# Patient Record
Sex: Male | Born: 1975 | Race: White | Hispanic: No | Marital: Married | Smoking: Never smoker
Health system: Southern US, Community
[De-identification: ages and names within clinical notes are randomized; demographics above are authoritative.]

## PROBLEM LIST (undated history)

## (undated) DIAGNOSIS — L570 Actinic keratosis: Secondary | ICD-10-CM

## (undated) DIAGNOSIS — I809 Phlebitis and thrombophlebitis of unspecified site: Secondary | ICD-10-CM

## (undated) DIAGNOSIS — S8290XA Unspecified fracture of unspecified lower leg, initial encounter for closed fracture: Secondary | ICD-10-CM

## (undated) HISTORY — PX: HAND SURGERY: SHX662

## (undated) HISTORY — DX: Unspecified fracture of unspecified lower leg, initial encounter for closed fracture: S82.90XA

## (undated) HISTORY — DX: Phlebitis and thrombophlebitis of unspecified site: I80.9

## (undated) HISTORY — DX: Actinic keratosis: L57.0

---

## 2009-10-26 LAB — HM COLONOSCOPY

## 2013-03-02 ENCOUNTER — Encounter: Payer: Self-pay | Admitting: Family Medicine

## 2013-09-29 ENCOUNTER — Other Ambulatory Visit: Payer: Self-pay | Admitting: *Deleted

## 2013-09-29 DIAGNOSIS — Z0000 Encounter for general adult medical examination without abnormal findings: Secondary | ICD-10-CM

## 2013-10-02 ENCOUNTER — Other Ambulatory Visit: Payer: Self-pay

## 2013-10-02 LAB — COMPLETE METABOLIC PANEL WITH GFR
ALT: 17 U/L (ref 0–53)
AST: 20 U/L (ref 0–37)
Albumin: 4.5 g/dL (ref 3.5–5.2)
Alkaline Phosphatase: 67 U/L (ref 39–117)
BUN: 17 mg/dL (ref 6–23)
CO2: 31 mEq/L (ref 19–32)
Calcium: 9.8 mg/dL (ref 8.4–10.5)
Chloride: 102 mEq/L (ref 96–112)
Creat: 0.99 mg/dL (ref 0.50–1.35)
GFR, Est African American: >89 mL/min
GFR, Est Non African American: >89 mL/min
Glucose, Bld: 94 mg/dL (ref 70–99)
Potassium: 4.2 mEq/L (ref 3.5–5.3)
Sodium: 138 mEq/L (ref 135–145)
Total Bilirubin: 0.7 mg/dL (ref 0.3–1.2)
Total Protein: 7.2 g/dL (ref 6.0–8.3)

## 2013-10-02 LAB — CBC WITH DIFFERENTIAL/PLATELET
Basophils Absolute: 0 10*3/uL (ref 0.0–0.1)
Basophils Relative: 0 % (ref 0–1)
Eosinophils Absolute: 0.3 10*3/uL (ref 0.0–0.7)
Eosinophils Relative: 7 % — ABNORMAL HIGH (ref 0–5)
HCT: 44.1 % (ref 39.0–52.0)
Hemoglobin: 16.1 g/dL (ref 13.0–17.0)
Lymphocytes Relative: 35 % (ref 12–46)
Lymphs Abs: 1.7 10*3/uL (ref 0.7–4.0)
MCH: 32.4 pg (ref 26.0–34.0)
MCHC: 36.5 g/dL — ABNORMAL HIGH (ref 30.0–36.0)
MCV: 88.7 fL (ref 78.0–100.0)
Monocytes Absolute: 0.5 10*3/uL (ref 0.1–1.0)
Monocytes Relative: 9 % (ref 3–12)
Neutro Abs: 2.5 10*3/uL (ref 1.7–7.7)
Neutrophils Relative %: 49 % (ref 43–77)
Platelets: 231 10*3/uL (ref 150–400)
RBC: 4.97 MIL/uL (ref 4.22–5.81)
RDW: 12.2 % (ref 11.5–15.5)
WBC: 4.9 10*3/uL (ref 4.0–10.5)

## 2013-10-02 LAB — TSH: TSH: 2.213 u[IU]/mL (ref 0.350–4.500)

## 2013-10-02 LAB — LIPID PANEL
Cholesterol: 163 mg/dL (ref 0–200)
HDL: 38 mg/dL — ABNORMAL LOW (ref >39)
LDL Cholesterol: 108 mg/dL — ABNORMAL HIGH (ref 0–99)
Total CHOL/HDL Ratio: 4.3 Ratio
Triglycerides: 86 mg/dL (ref <150)
VLDL: 17 mg/dL (ref 0–40)

## 2013-10-06 ENCOUNTER — Encounter: Payer: Self-pay | Admitting: Family Medicine

## 2013-10-06 ENCOUNTER — Encounter (INDEPENDENT_AMBULATORY_CARE_PROVIDER_SITE_OTHER): Payer: Self-pay

## 2013-10-06 ENCOUNTER — Ambulatory Visit (INDEPENDENT_AMBULATORY_CARE_PROVIDER_SITE_OTHER): Payer: BC Managed Care – PPO | Admitting: Family Medicine

## 2013-10-06 VITALS — BP 103/65 | HR 68 | Resp 16 | Ht 73.5 in | Wt 190.0 lb

## 2013-10-06 DIAGNOSIS — M940 Chondrocostal junction syndrome [Tietze]: Secondary | ICD-10-CM

## 2013-10-06 DIAGNOSIS — E785 Hyperlipidemia, unspecified: Secondary | ICD-10-CM

## 2013-10-06 MED ORDER — DICLOFENAC SODIUM 1 % TD GEL: 4.0000 g | Freq: Four times a day (QID) | TRANSDERMAL | Status: AC

## 2013-10-06 NOTE — Patient Instructions (Addendum)
Costochondritis Costochondritis (Tietze syndrome), or costochondral separation, is a swelling and irritation (inflammation) of the tissue (cartilage) that connects your ribs with your breastbone (sternum). It may occur on its own (spontaneously), through damage caused by an accident (trauma), or simply from coughing or minor exercise. It may take up to 6 weeks to get better and longer if you are unable to be conservative in your activities. HOME CARE INSTRUCTIONS   Avoid exhausting physical activity. Try not to strain your ribs during normal activity. This would include any activities using chest, belly (abdominal), and side muscles, especially if heavy weights are used.  Use ice for 15-20 minutes per hour while awake for the first 2 days. Place the ice in a plastic bag, and place a towel between the bag of ice and your skin.  Only take over-the-counter or prescription medicines for pain, discomfort, or fever as directed by your caregiver. SEEK IMMEDIATE MEDICAL CARE IF:   Your pain increases or you are very uncomfortable.  You have a fever.  You develop difficulty with your breathing.  You cough up blood.  You develop worse chest pains, shortness of breath, sweating, or vomiting.  You develop new, unexplained problems (symptoms). MAKE SURE YOU:   Understand these instructions.  Will watch your condition.  Will get help right away if you are not doing well or get worse. Document Released: 07/22/2005 Document Revised: 01/04/2012 Document Reviewed: 05/16/2013 [facility] Patient Information 2014 [place], [place].  Fat and Cholesterol Control Diet Fat and cholesterol levels in your blood and organs are influenced by your diet. High levels of fat and cholesterol may lead to diseases of the heart, small and large blood vessels, gallbladder, liver, and pancreas. CONTROLLING FAT AND CHOLESTEROL WITH DIET Although exercise and lifestyle factors are important, your diet is key. That is because  certain foods are known to raise cholesterol and others to lower it. The goal is to balance foods for their effect on cholesterol and more importantly, to replace saturated and trans fat with other types of fat, such as monounsaturated fat, polyunsaturated fat, and omega-3 fatty acids. On average, a person should consume no more than 15 to 17 g of saturated fat daily. Saturated and trans fats are considered "bad" fats, and they will raise LDL cholesterol. Saturated fats are primarily found in animal products such as meats, butter, and cream. However, that does not mean you need to give up all your favorite foods. Today, there are good tasting, low-fat, low-cholesterol substitutes for most of the things you like to eat. Choose low-fat or nonfat alternatives. Choose round or loin cuts of red meat. These types of cuts are lowest in fat and cholesterol. Chicken (without the skin), fish, veal, and ground [place] breast are great choices. Eliminate fatty meats, such as hot dogs and salami. Even shellfish have little or no saturated fat. Have a 3 oz (85 g) portion when you eat lean meat, poultry, or fish. Trans fats are also called "partially hydrogenated oils." They are oils that have been scientifically manipulated so that they are solid at room temperature resulting in a longer shelf life and improved taste and texture of foods in which they are added. Trans fats are found in stick margarine, some tub margarines, cookies, crackers, and baked goods.  When baking and cooking, oils are a great substitute for butter. The monounsaturated oils are especially beneficial since it is believed they lower LDL and raise HDL. The oils you should avoid entirely are saturated tropical oils, such as  coconut and palm.  Remember to eat a lot from food groups that are naturally free of saturated and trans fat, including fish, fruit, vegetables, beans, grains (barley, rice, couscous, bulgur wheat), and pasta (without cream sauces).    IDENTIFYING FOODS THAT LOWER FAT AND CHOLESTEROL  Soluble fiber may lower your cholesterol. This type of fiber is found in fruits such as apples, vegetables such as broccoli, potatoes, and carrots, legumes such as beans, peas, and lentils, and grains such as barley. Foods fortified with plant sterols (phytosterol) may also lower cholesterol. You should eat at least 2 g per day of these foods for a cholesterol lowering effect.  Read package labels to identify low-saturated fats, trans fat free, and low-fat foods at the supermarket. Select cheeses that have only 2 to 3 g saturated fat per ounce. Use a heart-healthy tub margarine that is free of trans fats or partially hydrogenated oil. When buying baked goods (cookies, crackers), avoid partially hydrogenated oils. Breads and muffins should be made from whole grains (whole-wheat or whole oat flour, instead of "flour" or "enriched flour"). Buy non-creamy canned soups with reduced salt and no added fats.  FOOD PREPARATION TECHNIQUES  Never deep-fry. If you must fry, either stir-fry, which uses very little fat, or use non-stick cooking sprays. When possible, broil, bake, or roast meats, and steam vegetables. Instead of putting butter or margarine on vegetables, use lemon and herbs, applesauce, and cinnamon (for squash and sweet potatoes). Use nonfat yogurt, salsa, and low-fat dressings for salads.  LOW-SATURATED FAT / LOW-FAT FOOD SUBSTITUTES Meats / Saturated Fat (g)  Avoid: Steak, marbled (3 oz/85 g) / 11 g  Choose: Steak, lean (3 oz/85 g) / 4 g  Avoid: Hamburger (3 oz/85 g) / 7 g  Choose: Hamburger, lean (3 oz/85 g) / 5 g  Avoid: Ham (3 oz/85 g) / 6 g  Choose: Ham, lean cut (3 oz/85 g) / 2.4 g  Avoid: Chicken, with skin, dark meat (3 oz/85 g) / 4 g  Choose: Chicken, skin removed, dark meat (3 oz/85 g) / 2 g  Avoid: Chicken, with skin, light meat (3 oz/85 g) / 2.5 g  Choose: Chicken, skin removed, light meat (3 oz/85 g) / 1 g Dairy /  Saturated Fat (g)  Avoid: Whole milk (1 cup) / 5 g  Choose: Low-fat milk, 2% (1 cup) / 3 g  Choose: Low-fat milk, 1% (1 cup) / 1.5 g  Choose: Skim milk (1 cup) / 0.3 g  Avoid: Hard cheese (1 oz/28 g) / 6 g  Choose: Skim milk cheese (1 oz/28 g) / 2 to 3 g  Avoid: Cottage cheese, 4% fat (1 cup) / 6.5 g  Choose: Low-fat cottage cheese, 1% fat (1 cup) / 1.5 g  Avoid: Ice cream (1 cup) / 9 g  Choose: Sherbet (1 cup) / 2.5 g  Choose: Nonfat frozen yogurt (1 cup) / 0.3 g  Choose: Frozen fruit bar / trace  Avoid: Whipped cream (1 tbs) / 3.5 g  Choose: Nondairy whipped topping (1 tbs) / 1 g Condiments / Saturated Fat (g)  Avoid: Mayonnaise (1 tbs) / 2 g  Choose: Low-fat mayonnaise (1 tbs) / 1 g  Avoid: Butter (1 tbs) / 7 g  Choose: Extra light margarine (1 tbs) / 1 g  Avoid: Coconut oil (1 tbs) / 11.8 g  Choose: Olive oil (1 tbs) / 1.8 g  Choose: Corn oil (1 tbs) / 1.7 g  Choose: Safflower oil (1 tbs) / 1.2  g  Choose: Sunflower oil (1 tbs) / 1.4 g  Choose: Soybean oil (1 tbs) / 2.4 g  Choose: Canola oil (1 tbs) / 1 g Document Released: 10/12/2005 Document Revised: 02/06/2013 Document Reviewed: 04/02/2011 [facility] Patient Information 2014 [place], [place].  ,/

## 2013-10-06 NOTE — Progress Notes (Signed)
   Subjective:    Patient ID: Leon Simmons, male    DOB: Mar 05, 1976, 37 y.o.   MRN: [id number]  HPI  [name] is here today to go over his most recent lab results.  He has done well since his last office visit.      Review of Systems  Constitutional: Negative for fatigue and unexpected weight change.  HENT: Negative.   Respiratory: Negative for shortness of breath.   Cardiovascular: Positive for chest pain (Pain consistent with costochondritis.). Negative for palpitations and leg swelling.  Gastrointestinal: Negative.   Genitourinary: Negative.   Musculoskeletal: Negative for myalgias.  Skin: Negative.   Neurological: Negative.   Psychiatric/Behavioral: Negative.   All other systems reviewed and are negative.    Past Medical History  Diagnosis Date  . Hyperlipidemia   . Phlebitis      History reviewed. No pertinent past surgical history.   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      Education:  Master's Degree Engineer, site)    Tobacco Use/Exposure:  None    Alcohol Use:  Occasional   Drug Use:  None   Diet:  Regular   Exercise: 2-3 times per week (Aerobic/Kettlebell)    Hobbies: Golf      Family History  Problem Relation Age of Onset  . Heart disease Maternal Grandfather     Complications after a MI   . Cancer Paternal Grandfather     Prostate      No Known Allergies   Immunization History  Administered Date(s) Administered  . Tdap 09/30/2006      Objective:   Physical Exam  Vitals reviewed. Constitutional: He is oriented to person, place, and time. He appears well-nourished. No distress.  HENT:  Head: Normocephalic.  Eyes: No scleral icterus.  Neck: Neck supple. No thyromegaly present.  Cardiovascular: Normal rate,  regular rhythm and normal heart sounds.   Pulmonary/Chest: Effort normal and breath sounds normal.    Musculoskeletal: Normal range of motion. He exhibits no edema.  Neurological: He is alert and oriented to person, place, and time.  Skin: Skin is warm and dry. No rash noted.  Psychiatric: He has a normal mood and affect. His behavior is normal. Judgment and thought content normal.      Assessment & Plan:    [name] was seen today for lab results.  Diagnoses and associated orders for this visit:  Other and unspecified hyperlipidemia Comments: His LDL is mildly elevated.  He'll work harder on limiting his red meat and increasing his fiber.    Costochondritis - diclofenac sodium (VOLTAREN) 1 % GEL; Apply 4 g topically 4 (four) times daily.

## 2013-10-25 ENCOUNTER — Telehealth: Payer: Self-pay | Admitting: *Deleted

## 2013-10-25 NOTE — Telephone Encounter (Signed)
I rec'd a call from the pharmacy - the PA for Voltaren Gel was denied.  Is there an alternative you would like him on? I called him, his pain level is not severe.

## 2013-10-30 DIAGNOSIS — M940 Chondrocostal junction syndrome [Tietze]: Secondary | ICD-10-CM | POA: Insufficient documentation

## 2016-03-16 ENCOUNTER — Ambulatory Visit (INDEPENDENT_AMBULATORY_CARE_PROVIDER_SITE_OTHER): Payer: 59 | Admitting: Physician Assistant

## 2016-03-16 ENCOUNTER — Encounter: Payer: Self-pay | Admitting: Physician Assistant

## 2016-03-16 VITALS — BP 102/70 | HR 56 | Temp 98.0°F | Ht 73.5 in | Wt 190.2 lb

## 2016-03-16 DIAGNOSIS — Z0000 Encounter for general adult medical examination without abnormal findings: Secondary | ICD-10-CM

## 2016-03-16 LAB — COMPREHENSIVE METABOLIC PANEL
ALT: 15 U/L (ref 0–53)
AST: 17 U/L (ref 0–37)
Albumin: 4.3 g/dL (ref 3.5–5.2)
Alkaline Phosphatase: 49 U/L (ref 39–117)
BILIRUBIN TOTAL: 0.8 mg/dL (ref 0.2–1.2)
BUN: 13 mg/dL (ref 6–23)
CALCIUM: 9.6 mg/dL (ref 8.4–10.5)
CO2: 30 mEq/L (ref 19–32)
Chloride: 104 mEq/L (ref 96–112)
Creatinine, Ser: 0.88 mg/dL (ref 0.40–1.50)
GFR: 102.38 mL/min (ref >60.00)
Glucose, Bld: 95 mg/dL (ref 70–99)
POTASSIUM: 4 meq/L (ref 3.5–5.1)
Sodium: 140 mEq/L (ref 135–145)
Total Protein: 6.5 g/dL (ref 6.0–8.3)

## 2016-03-16 LAB — LIPID PANEL
CHOLESTEROL: 153 mg/dL (ref 0–200)
HDL: 40.7 mg/dL (ref >39.00)
LDL CALC: 86 mg/dL (ref 0–99)
NonHDL: 111.83
TRIGLYCERIDES: 131 mg/dL (ref 0.0–149.0)
Total CHOL/HDL Ratio: 4
VLDL: 26.2 mg/dL (ref 0.0–40.0)

## 2016-03-16 LAB — URINALYSIS, ROUTINE W REFLEX MICROSCOPIC
BILIRUBIN URINE: NEGATIVE
Hgb urine dipstick: NEGATIVE
Ketones, ur: NEGATIVE
LEUKOCYTES UA: NEGATIVE
Nitrite: NEGATIVE
RBC / HPF: NONE SEEN (ref 0–?)
SPECIFIC GRAVITY, URINE: 1.015 (ref 1.000–1.030)
Total Protein, Urine: NEGATIVE
UROBILINOGEN UA: 0.2 (ref 0.0–1.0)
Urine Glucose: NEGATIVE
WBC UA: NONE SEEN (ref 0–?)
pH: 6 (ref 5.0–8.0)

## 2016-03-16 LAB — CBC
HEMATOCRIT: 43.6 % (ref 39.0–52.0)
HEMOGLOBIN: 14.8 g/dL (ref 13.0–17.0)
MCHC: 34 g/dL (ref 30.0–36.0)
MCV: 94.2 fl (ref 78.0–100.0)
PLATELETS: 209 10*3/uL (ref 150.0–400.0)
RBC: 4.63 Mil/uL (ref 4.22–5.81)
RDW: 12.3 % (ref 11.5–15.5)
WBC: 5.8 10*3/uL (ref 4.0–10.5)

## 2016-03-16 LAB — HEMOGLOBIN A1C: Hgb A1c MFr Bld: 5.3 % (ref 4.6–6.5)

## 2016-03-16 LAB — TSH: TSH: 2.46 u[IU]/mL (ref 0.35–4.50)

## 2016-03-16 NOTE — Progress Notes (Signed)
Pre visit review using our clinic review tool, if applicable. No additional management support is needed unless otherwise documented below in the visit note. 

## 2016-03-16 NOTE — Patient Instructions (Signed)
Please go to the lab for blood work.   Our office will call you with your results unless you have chosen to receive results via MyChart.  If your blood work is normal we will follow-up each year for physicals and as scheduled for chronic medical problems.  If anything is abnormal we will treat accordingly and get you in for a follow-up.  Preventive Care for Adults, Male A healthy lifestyle and preventive care can promote health and wellness. Preventive health guidelines for men include the following key practices:  A routine yearly physical is a good way to check with your health care provider about your health and preventative screening. It is a chance to share any concerns and updates on your health and to receive a thorough exam.  Visit your dentist for a routine exam and preventative care every 6 months. Brush your teeth twice a day and floss once a day. Good oral hygiene prevents tooth decay and gum disease.  The frequency of eye exams is based on your age, health, family medical history, use of contact lenses, and other factors. Follow your health care provider's recommendations for frequency of eye exams.  Eat a healthy diet. Foods such as vegetables, fruits, whole grains, low-fat dairy products, and lean protein foods contain the nutrients you need without too many calories. Decrease your intake of foods high in solid fats, added sugars, and salt. Eat the right amount of calories for you.Get information about a proper diet from your health care provider, if necessary.  Regular physical exercise is one of the most important things you can do for your health. Most adults should get at least 150 minutes of moderate-intensity exercise (any activity that increases your heart rate and causes you to sweat) each week. In addition, most adults need muscle-strengthening exercises on 2 or more days a week.  Maintain a healthy weight. The body mass index (BMI) is a screening tool to identify  possible weight problems. It provides an estimate of body fat based on height and weight. Your health care provider can find your BMI and can help you achieve or maintain a healthy weight.For adults 20 years and older:  A BMI below 18.5 is considered underweight.  A BMI of 18.5 to 24.9 is normal.  A BMI of 25 to 29.9 is considered overweight.  A BMI of 30 and above is considered obese.  Maintain normal blood lipids and cholesterol levels by exercising and minimizing your intake of saturated fat. Eat a balanced diet with plenty of fruit and vegetables. Blood tests for lipids and cholesterol should begin at age 73 and be repeated every 5 years. If your lipid or cholesterol levels are high, you are over 50, or you are at high risk for heart disease, you may need your cholesterol levels checked more frequently.Ongoing high lipid and cholesterol levels should be treated with medicines if diet and exercise are not working.  If you smoke, find out from your health care provider how to quit. If you do not use tobacco, do not start.  Lung cancer screening is recommended for adults aged 50-80 years who are at high risk for developing lung cancer because of a history of smoking. A yearly low-dose CT scan of the lungs is recommended for people who have at least a 30-pack-year history of smoking and are a current smoker or have quit within the past 15 years. A pack year of smoking is smoking an average of 1 pack of cigarettes a day for 1  year (for example: 1 pack a day for 30 years or 2 packs a day for 15 years). Yearly screening should continue until the smoker has stopped smoking for at least 15 years. Yearly screening should be stopped for people who develop a health problem that would prevent them from having lung cancer treatment.  If you choose to drink alcohol, do not have more than 2 drinks per day. One drink is considered to be 12 ounces (355 mL) of beer, 5 ounces (148 mL) of wine, or 1.5 ounces (44  mL) of liquor.  Avoid use of street drugs. Do not share needles with anyone. Ask for help if you need support or instructions about stopping the use of drugs.  High blood pressure causes heart disease and increases the risk of stroke. Your blood pressure should be checked at least every 1-2 years. Ongoing high blood pressure should be treated with medicines, if weight loss and exercise are not effective.  If you are 22-45 years old, ask your health care provider if you should take aspirin to prevent heart disease.  Diabetes screening is done by taking a blood sample to check your blood glucose level after you have not eaten for a certain period of time (fasting). If you are not overweight and you do not have risk factors for diabetes, you should be screened once every 3 years starting at age 11. If you are overweight or obese and you are 12-39 years of age, you should be screened for diabetes every year as part of your cardiovascular risk assessment.  Colorectal cancer can be detected and often prevented. Most routine colorectal cancer screening begins at the age of 61 and continues through age 68. However, your health care provider may recommend screening at an earlier age if you have risk factors for colon cancer. On a yearly basis, your health care provider may provide home test kits to check for hidden blood in the stool. Use of a small camera at the end of a tube to directly examine the colon (sigmoidoscopy or colonoscopy) can detect the earliest forms of colorectal cancer. Talk to your health care provider about this at age 69, when routine screening begins. Direct exam of the colon should be repeated every 5-10 years through age 62, unless early forms of precancerous polyps or small growths are found.  People who are at an increased risk for hepatitis B should be screened for this virus. You are considered at high risk for hepatitis B if:  You were born in a country where hepatitis B occurs  often. Talk with your health care provider about which countries are considered high risk.  Your parents were born in a high-risk country and you have not received a shot to protect against hepatitis B (hepatitis B vaccine).  You have HIV or AIDS.  You use needles to inject street drugs.  You live with, or have sex with, someone who has hepatitis B.  You are a man who has sex with other men (MSM).  You get hemodialysis treatment.  You take certain medicines for conditions such as cancer, organ transplantation, and autoimmune conditions.  Hepatitis C blood testing is recommended for all people born from [birth year] through 1965 and any individual with known risks for hepatitis C.  Practice safe sex. Use condoms and avoid high-risk sexual practices to reduce the spread of sexually transmitted infections (STIs). STIs include gonorrhea, chlamydia, syphilis, trichomonas, herpes, HPV, and human immunodeficiency virus (HIV). Herpes, HIV, and HPV are viral illnesses  that have no cure. They can result in disability, cancer, and death.  If you are a man who has sex with other men, you should be screened at least once per year for:  HIV.  Urethral, rectal, and pharyngeal infection of gonorrhea, chlamydia, or both.  If you are at risk of being infected with HIV, it is recommended that you take a prescription medicine daily to prevent HIV infection. This is called preexposure prophylaxis (PrEP). You are considered at risk if:  You are a man who has sex with other men (MSM) and have other risk factors.  You are a heterosexual man, are sexually active, and are at increased risk for HIV infection.  You take drugs by injection.  You are sexually active with a partner who has HIV.  Talk with your health care provider about whether you are at high risk of being infected with HIV. If you choose to begin PrEP, you should first be tested for HIV. You should then be tested every 3 months for as long as you are  taking PrEP.  A one-time screening for abdominal aortic aneurysm (AAA) and surgical repair of large AAAs by ultrasound are recommended for men ages 47 to 19 years who are current or former smokers.  Healthy men should no longer receive prostate-specific antigen (PSA) blood tests as part of routine cancer screening. Talk with your health care provider about prostate cancer screening.  Testicular cancer screening is not recommended for adult males who have no symptoms. Screening includes self-exam, a health care provider exam, and other screening tests. Consult with your health care provider about any symptoms you have or any concerns you have about testicular cancer.  Use sunscreen. Apply sunscreen liberally and repeatedly throughout the day. You should seek shade when your shadow is shorter than you. Protect yourself by wearing long sleeves, pants, a wide-brimmed hat, and sunglasses year round, whenever you are outdoors.  Once a month, do a whole-body skin exam, using a mirror to look at the skin on your back. Tell your health care provider about new moles, moles that have irregular borders, moles that are larger than a pencil eraser, or moles that have changed in shape or color.  Stay current with required vaccines (immunizations).  Influenza vaccine. All adults should be immunized every year.  Tetanus, diphtheria, and acellular pertussis (Td, Tdap) vaccine. An adult who has not previously received Tdap or who does not know his vaccine status should receive 1 dose of Tdap. This initial dose should be followed by tetanus and diphtheria toxoids (Td) booster doses every 10 years. Adults with an unknown or incomplete history of completing a 3-dose immunization series with Td-containing vaccines should begin or complete a primary immunization series including a Tdap dose. Adults should receive a Td booster every 10 years.  Varicella vaccine. An adult without evidence of immunity to varicella should  receive 2 doses or a second dose if he has previously received 1 dose.  Human papillomavirus (HPV) vaccine. Males aged 11-21 years who have not received the vaccine previously should receive the 3-dose series. Males aged 22-26 years may be immunized. Immunization is recommended through the age of 16 years for any male who has sex with males and did not get any or all doses earlier. Immunization is recommended for any person with an immunocompromised condition through the age of 63 years if he did not get any or all doses earlier. During the 3-dose series, the second dose should be obtained 4-8 weeks  after the first dose. The third dose should be obtained 24 weeks after the first dose and 16 weeks after the second dose.  Zoster vaccine. One dose is recommended for adults aged 32 years or older unless certain conditions are present.  Measles, mumps, and rubella (MMR) vaccine. Adults born before [birth year] generally are considered immune to measles and mumps. Adults born in [birth year] or later should have 1 or more doses of MMR vaccine unless there is a contraindication to the vaccine or there is laboratory evidence of immunity to each of the three diseases. A routine second dose of MMR vaccine should be obtained at least 28 days after the first dose for students attending postsecondary schools, health care workers, or international travelers. People who received inactivated measles vaccine or an unknown type of measles vaccine during 1963-1967 should receive 2 doses of MMR vaccine. People who received inactivated mumps vaccine or an unknown type of mumps vaccine before 1979 and are at high risk for mumps infection should consider immunization with 2 doses of MMR vaccine. Unvaccinated health care workers born before [birth year] who lack laboratory evidence of measles, mumps, or rubella immunity or laboratory confirmation of disease should consider measles and mumps immunization with 2 doses of MMR vaccine or rubella immunization  with 1 dose of MMR vaccine.  Pneumococcal 13-valent conjugate (PCV13) vaccine. When indicated, a person who is uncertain of his immunization history and has no record of immunization should receive the PCV13 vaccine. All adults 35 years of age and older should receive this vaccine. An adult aged 38 years or older who has certain medical conditions and has not been previously immunized should receive 1 dose of PCV13 vaccine. This PCV13 should be followed with a dose of pneumococcal polysaccharide (PPSV23) vaccine. Adults who are at high risk for pneumococcal disease should obtain the PPSV23 vaccine at least 8 weeks after the dose of PCV13 vaccine. Adults older than 39 years of age who have normal immune system function should obtain the PPSV23 vaccine dose at least 1 year after the dose of PCV13 vaccine.  Pneumococcal polysaccharide (PPSV23) vaccine. When PCV13 is also indicated, PCV13 should be obtained first. All adults aged 11 years and older should be immunized. An adult younger than age 75 years who has certain medical conditions should be immunized. Any person who resides in a nursing home or long-term care facility should be immunized. An adult smoker should be immunized. People with an immunocompromised condition and certain other conditions should receive both PCV13 and PPSV23 vaccines. People with human immunodeficiency virus (HIV) infection should be immunized as soon as possible after diagnosis. Immunization during chemotherapy or radiation therapy should be avoided. Routine use of PPSV23 vaccine is not recommended for American Indians, [place] Natives, or people younger than 65 years unless there are medical conditions that require PPSV23 vaccine. When indicated, people who have unknown immunization and have no record of immunization should receive PPSV23 vaccine. One-time revaccination 5 years after the first dose of PPSV23 is recommended for people aged 19-64 years who have chronic kidney failure,  nephrotic syndrome, asplenia, or immunocompromised conditions. People who received 1-2 doses of PPSV23 before age 31 years should receive another dose of PPSV23 vaccine at age 85 years or later if at least 5 years have passed since the previous dose. Doses of PPSV23 are not needed for people immunized with PPSV23 at or after age 44 years.  Meningococcal vaccine. Adults with asplenia or persistent complement component deficiencies should receive 2 doses of quadrivalent  meningococcal conjugate (MenACWY-D) vaccine. The doses should be obtained at least 2 months apart. Microbiologists working with certain meningococcal bacteria, [employer] recruits, people at risk during an outbreak, and people who travel to or live in countries with a high rate of meningitis should be immunized. A first-year college student up through age 54 years who is living in a residence hall should receive a dose if he did not receive a dose on or after his 16th birthday. Adults who have certain high-risk conditions should receive one or more doses of vaccine.  Hepatitis A vaccine. Adults who wish to be protected from this disease, have chronic liver disease, work with hepatitis A-infected animals, work in hepatitis A research labs, or travel to or work in countries with a high rate of hepatitis A should be immunized. Adults who were previously unvaccinated and who anticipate close contact with an international adoptee during the first 60 days after arrival in the [place] from a country with a high rate of hepatitis A should be immunized.  Hepatitis B vaccine. Adults should be immunized if they wish to be protected from this disease, are under age 30 years and have diabetes, have chronic liver disease, have had more than one sex partner in the past 6 months, may be exposed to blood or other infectious body fluids, are household contacts or sex partners of hepatitis B positive people, are clients or workers in certain care facilities, or  travel to or work in countries with a high rate of hepatitis B.  Haemophilus influenzae type b (Hib) vaccine. A previously unvaccinated person with asplenia or sickle cell disease or having a scheduled splenectomy should receive 1 dose of Hib vaccine. Regardless of previous immunization, a recipient of a hematopoietic stem cell transplant should receive a 3-dose series 6-12 months after his successful transplant. Hib vaccine is not recommended for adults with HIV infection. Preventive Service / Frequency Ages 39 to 81  Blood pressure check.** / Every 3-5 years.  Lipid and cholesterol check.** / Every 5 years beginning at age 30.  Hepatitis C blood test.** / For any individual with known risks for hepatitis C.  Skin self-exam. / Monthly.  Influenza vaccine. / Every year.  Tetanus, diphtheria, and acellular pertussis (Tdap, Td) vaccine.** / Consult your health care provider. 1 dose of Td every 10 years.  Varicella vaccine.** / Consult your health care provider.  HPV vaccine. / 3 doses over 6 months, if 68 or younger.  Measles, mumps, rubella (MMR) vaccine.** / You need at least 1 dose of MMR if you were born in 1957 or later. You may also need a second dose.  Pneumococcal 13-valent conjugate (PCV13) vaccine.** / Consult your health care provider.  Pneumococcal polysaccharide (PPSV23) vaccine.** / 1 to 2 doses if you smoke cigarettes or if you have certain conditions.  Meningococcal vaccine.** / 1 dose if you are age 78 to 67 years and a [employer] researcher living in a residence hall, or have one of several medical conditions. You may also need additional booster doses.  Hepatitis A vaccine.** / Consult your health care provider.  Hepatitis B vaccine.** / Consult your health care provider.  Haemophilus influenzae type b (Hib) vaccine.** / Consult your health care provider. Ages 33 to 42  Blood pressure check.** / Every year.  Lipid and cholesterol check.** / Every 5 years  beginning at age 54.  Lung cancer screening. / Every year if you are aged 31-80 years and have a 30-pack-year history of smoking  and currently smoke or have quit within the past 15 years. Yearly screening is stopped once you have quit smoking for at least 15 years or develop a health problem that would prevent you from having lung cancer treatment.  Fecal occult blood test (FOBT) of stool. / Every year beginning at age 71 and continuing until age 99. You may not have to do this test if you get a colonoscopy every 10 years.  Flexible sigmoidoscopy** or colonoscopy.** / Every 5 years for a flexible sigmoidoscopy or every 10 years for a colonoscopy beginning at age 18 and continuing until age 46.  Hepatitis C blood test.** / For all people born from [birth year] through 1965 and any individual with known risks for hepatitis C.  Skin self-exam. / Monthly.  Influenza vaccine. / Every year.  Tetanus, diphtheria, and acellular pertussis (Tdap/Td) vaccine.** / Consult your health care provider. 1 dose of Td every 10 years.  Varicella vaccine.** / Consult your health care provider.  Zoster vaccine.** / 1 dose for adults aged 65 years or older.  Measles, mumps, rubella (MMR) vaccine.** / You need at least 1 dose of MMR if you were born in 1956 or later. You may also need a second dose.  Pneumococcal 13-valent conjugate (PCV13) vaccine.** / Consult your health care provider.  Pneumococcal polysaccharide (PPSV23) vaccine.** / 1 to 2 doses if you smoke cigarettes or if you have certain conditions.  Meningococcal vaccine.** / Consult your health care provider.  Hepatitis A vaccine.** / Consult your health care provider.  Hepatitis B vaccine.** / Consult your health care provider.  Haemophilus influenzae type b (Hib) vaccine.** / Consult your health care provider. Ages 45 and over  Blood pressure check.** / Every year.  Lipid and cholesterol check.**/ Every 5 years beginning at age 23.  Lung cancer  screening. / Every year if you are aged 45-80 years and have a 30-pack-year history of smoking and currently smoke or have quit within the past 15 years. Yearly screening is stopped once you have quit smoking for at least 15 years or develop a health problem that would prevent you from having lung cancer treatment.  Fecal occult blood test (FOBT) of stool. / Every year beginning at age 68 and continuing until age 35. You may not have to do this test if you get a colonoscopy every 10 years.  Flexible sigmoidoscopy** or colonoscopy.** / Every 5 years for a flexible sigmoidoscopy or every 10 years for a colonoscopy beginning at age 19 and continuing until age 70.  Hepatitis C blood test.** / For all people born from [birth year] through 1964 and any individual with known risks for hepatitis C.  Abdominal aortic aneurysm (AAA) screening.** / A one-time screening for ages 55 to 48 years who are current or former smokers.  Skin self-exam. / Monthly.  Influenza vaccine. / Every year.  Tetanus, diphtheria, and acellular pertussis (Tdap/Td) vaccine.** / 1 dose of Td every 10 years.  Varicella vaccine.** / Consult your health care provider.  Zoster vaccine.** / 1 dose for adults aged 32 years or older.  Pneumococcal 13-valent conjugate (PCV13) vaccine.** / 1 dose for all adults aged 30 years and older.  Pneumococcal polysaccharide (PPSV23) vaccine.** / 1 dose for all adults aged 37 years and older.  Meningococcal vaccine.** / Consult your health care provider.  Hepatitis A vaccine.** / Consult your health care provider.  Hepatitis B vaccine.** / Consult your health care provider.  Haemophilus influenzae type b (Hib) vaccine.** / Consult your health  care provider. **Family history and personal history of risk and conditions may change your health care provider's recommendations.   This information is not intended to replace advice given to you by your health care provider. Make sure you discuss any  questions you have with your health care provider.   Document Released: 12/08/2001 Document Revised: 11/02/2014 Document Reviewed: 03/09/2011 [employer]vier Interactive Patient Education [employer].

## 2016-03-16 NOTE — Progress Notes (Signed)
Patient presents to clinic today to establish care. Is requesting a CPE today. Is fasting for labs.   Body mass index is 24.75 kg/(m^2). Patient is eating a well-balanced diet. Is avoiding sodas and drinking mostly water. Eats most meals at home. Patient participates in a exercise class at work that occurs a few times per week. Is exercising at home on weekends.   Health Maintenance: Immunizations -- Tetanus up-to-date.  HIV -- up-to-date. Blood donor.  Past Medical History  Diagnosis Date  . Phlebitis   . Broken leg   . Actinic keratosis     Past Surgical History  Procedure Laterality Date  . Hand surgery      No current outpatient prescriptions on file prior to visit.   No current facility-administered medications on file prior to visit.    No Known Allergies  Family History  Problem Relation Age of Onset  . Heart disease Maternal Grandfather 60    Complications after a MI   . Cancer Paternal Grandfather 96    Prostate   . Arthritis Maternal Grandmother   . Healthy Child     x 2   . Heart disease Father   . Cancer Father     Colon Polyps  . Cancer Paternal Uncle     Prostate 28    Social History   Social History  . Marital Status: Married    Spouse Name: N/A  . Number of Children: 2  . Years of Education: N/A   Occupational History  . Not on file.   Social History Main Topics  . Smoking status: Never Smoker   . Smokeless tobacco: Never Used  . Alcohol Use: 0.0 oz/week    0 Standard drinks or equivalent per week     Comment: rare  . Drug Use: No  . Sexual Activity:    Partners: Female     Comment: wife   Other Topics Concern  . 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      Education:  Master's Degree  Public affairs consultant)    Tobacco Use/Exposure:  None    Alcohol Use:  Occasional   Drug Use:  None   Diet:  Regular   Exercise: 2-3 times per week (Aerobic/Kettlebell)    Hobbies: Golf    Review of Systems  Constitutional: Negative for fever and weight loss.  HENT: Negative for ear discharge, ear pain, hearing loss and tinnitus.   Eyes: Negative for blurred vision, double vision, photophobia and pain.  Respiratory: Negative for cough and shortness of breath.   Cardiovascular: Negative for chest pain and palpitations.  Gastrointestinal: Negative for heartburn, nausea, vomiting, abdominal pain, diarrhea, constipation, blood in stool and melena.  Genitourinary: Negative for dysuria, urgency, frequency, hematuria and flank pain.  Musculoskeletal: Negative for falls.  Neurological: Negative for dizziness, loss of consciousness and headaches.  Endo/Heme/Allergies: Negative for environmental allergies.  Psychiatric/Behavioral: Negative for depression, suicidal ideas, hallucinations and substance abuse. The patient is not nervous/anxious and does not have insomnia.     BP 102/70 mmHg  Pulse 56  Temp(Src) 98 F (36.7 C) (Oral)  Ht 6' 1.5" (1.867 m)  Wt 190 lb 3.2 oz (86.274 kg)  BMI 24.75 kg/m2  SpO2 98%  Physical  Exam  Constitutional: He is oriented to person, place, and time and well-developed, well-nourished, and in no distress.  HENT:  Head: Normocephalic and atraumatic.  Right Ear: External ear normal.  Left Ear: External ear normal.  Nose: Nose normal.  Mouth/Throat: Oropharynx is clear and moist. No oropharyngeal exudate.  Eyes: Conjunctivae and EOM are normal. Pupils are equal, round, and reactive to light.  Neck: Neck supple. No thyromegaly present.  Cardiovascular: Normal rate, regular rhythm, normal heart sounds and intact distal pulses.   Pulmonary/Chest: Effort normal and breath sounds normal. No respiratory distress. He has no wheezes. He has no rales. He exhibits no  tenderness.  Abdominal: Soft. Bowel sounds are normal. He exhibits no distension and no mass. There is no tenderness. There is no rebound and no guarding.  Genitourinary: Testes/scrotum normal.  Lymphadenopathy:    He has no cervical adenopathy.  Neurological: He is alert and oriented to person, place, and time.  Skin: Skin is warm and dry. No rash noted.  Psychiatric: Affect normal.  Vitals reviewed.   Assessment/Plan: Visit for preventive health examination Depression screen negative. Health Maintenance reviewed -- Immunizations up-to-date. Preventive schedule discussed and handout given in AVS. Will obtain fasting labs today.     Leeanne Rio, PA-C

## 2016-03-16 NOTE — Assessment & Plan Note (Signed)
Depression screen negative. Health Maintenance reviewed -- Immunizations up-to-date. Preventive schedule discussed and handout given in AVS. Will obtain fasting labs today.  

## 2016-03-17 ENCOUNTER — Encounter: Payer: Self-pay | Admitting: *Deleted

## 2017-05-06 ENCOUNTER — Telehealth: Payer: Self-pay | Admitting: Physician Assistant

## 2017-05-06 NOTE — Telephone Encounter (Signed)
Pt called in to transition care. He said that he and his family would like to all see Dr. Lorelei Pont. Pt's spouse has already been established with her.    Is this switch okay?     ALSO, pt says that he would like to have a visit with provider. He need a PFC completed.     Please advise for scheduling.

## 2017-05-06 NOTE — Telephone Encounter (Signed)
Transfer is fine with me. Have only seen patient once over a year ago for a physical.

## 2017-05-06 NOTE — Telephone Encounter (Signed)
Pt has been scheduled.  °

## 2017-05-06 NOTE — Telephone Encounter (Signed)
Sure, that is fine.

## 2017-05-07 ENCOUNTER — Telehealth: Payer: Self-pay

## 2017-05-07 NOTE — Telephone Encounter (Signed)
Pre visit call completd. 

## 2017-05-08 NOTE — Progress Notes (Signed)
[facility] [place], [place], [place] [address] [phone]  Date:  05/10/2017   Name:  Leon Simmons   DOB:  1975-11-11   MRN:  [id number]  PCP:  Leon Mclean, MD    Chief Complaint: Annual Exam (Transfer of Care)   History of Present Illness:  Leon Simmons is a 41 y.o. very pleasant male patient who presents with the following:  Here today for a CPE Former pt of Leon Aquas PA-C who has moved to a new location He is fasting today for labs He donates platelets with the red cross  Last seen here about a year ago, last labs just over a year ago.   His father was recently dx with primary sclerosing cholangitis- he was having belly pain.  His paternal aunt also died of liver failure in her 31s.   He is fasting today for labs  He is feeling well overall His parents live in [place], he went to [employer] in [place]. Moved here after college.  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  His son is 62, and his daughter is 1. They are starting 2nd and 1st grade this fall. They like their school   I take care of his wife Leon Simmons as well  He enjoys golfing He does exercise on a regular basis; he lifts weights a couple of times a week at work and rides his bike He broke his right tibia at age 7 and gave up soccer at that time.    No issues with CP or SOB while exercising Last tetanus shot was in 2007.  He is ok with a booster today  Patient Active Problem List   Diagnosis Date Noted  . Visit for preventive health examination 03/16/2016    Past Medical History:  Diagnosis Date  . Actinic keratosis   . Broken leg   . Phlebitis     Past Surgical History:  Procedure Laterality Date  . HAND SURGERY      Social History  Substance Use Topics  . Smoking status: Never Smoker  . Smokeless tobacco: Never Used  . Alcohol use 0.0 oz/week     Comment: rare    Family  History  Problem Relation Age of Onset  . Heart disease Maternal Grandfather 60       Complications after a MI   . Cancer Paternal Grandfather 78       Prostate   . Arthritis Maternal Grandmother   . Healthy Child        x 2   . Heart disease Father   . Cancer Father        Colon Polyps  . Cancer Paternal Uncle        Prostate 60    No Known Allergies  Medication list has been reviewed and updated.  Current Outpatient Prescriptions on File Prior to Visit  Medication Sig Dispense Refill  . Multiple Vitamin (MULTIVITAMIN) tablet Take 1 tablet by mouth daily.     No current facility-administered medications on file prior to visit.     Review of Systems:  As per HPI- otherwise negative. Denies any CP or SOB with exercise No issues with urination or erections he does have Peyronie disease but this is stable and not worse at all.     Physical Examination: Vitals:   05/10/17 0931  BP: 114/75  Pulse:  70  Temp: 98.2 F (36.8 C)   Vitals:   05/10/17 0931  Weight: 189 lb 12.8 oz (86.1 kg)  Height: 6' 1.5" (1.867 m)   Body mass index is 24.7 kg/m. Ideal Body Weight: Weight in (lb) to have BMI = 25: 191.7  GEN: WDWN, NAD, Non-toxic, A & O x 3, normal weight, looks well HEENT: Atraumatic, Normocephalic. Neck supple. No masses, No LAD.  Bilateral TM wnl, oropharynx normal.  PEERL,EOMI.   Ears and Nose: No external deformity. CV: RRR, No M/G/R. No JVD. No thrill. No extra heart sounds. PULM: CTA B, no wheezes, crackles, rhonchi. No retractions. No resp. distress. No accessory muscle use. ABD: S, NT, ND. No rebound. No HSM. EXTR: No c/c/e NEURO Normal gait.  PSYCH: Normally interactive. Conversant. Not depressed or anxious appearing.  Calm demeanor.    Assessment and Plan Physical exam  Screening for deficiency anemia - Plan: CBC  Screening for diabetes mellitus - Plan: Comprehensive metabolic panel, Hemoglobin A1c  Screening for hyperlipidemia - Plan: Lipid  panel  Generally healthy young man here today for a CPE Labs pending as above Will plan further follow- up pending labs. His father was recently dx with PSC- will make sure that his liver function looks good today    Signed Leon Blinks, MD

## 2017-05-10 ENCOUNTER — Ambulatory Visit (INDEPENDENT_AMBULATORY_CARE_PROVIDER_SITE_OTHER): Payer: 59 | Admitting: Family Medicine

## 2017-05-10 ENCOUNTER — Encounter: Payer: Self-pay | Admitting: Family Medicine

## 2017-05-10 VITALS — BP 114/75 | HR 70 | Temp 98.2°F | Ht 73.5 in | Wt 189.8 lb

## 2017-05-10 DIAGNOSIS — Z131 Encounter for screening for diabetes mellitus: Secondary | ICD-10-CM | POA: Diagnosis not present

## 2017-05-10 DIAGNOSIS — Z13220 Encounter for screening for lipoid disorders: Secondary | ICD-10-CM | POA: Diagnosis not present

## 2017-05-10 DIAGNOSIS — Z130 Encounter for screening for diseases of the blood and blood-forming organs and certain disorders involving the immune mechanism: Secondary | ICD-10-CM | POA: Diagnosis not present

## 2017-05-10 DIAGNOSIS — Z0000 Encounter for general adult medical examination without abnormal findings: Secondary | ICD-10-CM | POA: Diagnosis not present

## 2017-05-10 LAB — COMPREHENSIVE METABOLIC PANEL
ALK PHOS: 44 U/L (ref 39–117)
ALT: 13 U/L (ref 0–53)
AST: 16 U/L (ref 0–37)
Albumin: 4.5 g/dL (ref 3.5–5.2)
BILIRUBIN TOTAL: 0.8 mg/dL (ref 0.2–1.2)
BUN: 19 mg/dL (ref 6–23)
CO2: 29 meq/L (ref 19–32)
Calcium: 10 mg/dL (ref 8.4–10.5)
Chloride: 101 mEq/L (ref 96–112)
Creatinine, Ser: 0.98 mg/dL (ref 0.40–1.50)
GFR: 89.89 mL/min (ref >60.00)
Glucose, Bld: 87 mg/dL (ref 70–99)
Potassium: 4.4 mEq/L (ref 3.5–5.1)
SODIUM: 138 meq/L (ref 135–145)
TOTAL PROTEIN: 6.9 g/dL (ref 6.0–8.3)

## 2017-05-10 LAB — HEMOGLOBIN A1C: HEMOGLOBIN A1C: 5 % (ref 4.6–6.5)

## 2017-05-10 LAB — LIPID PANEL
CHOL/HDL RATIO: 3
Cholesterol: 146 mg/dL (ref 0–200)
HDL: 48.9 mg/dL (ref >39.00)
LDL Cholesterol: 82 mg/dL (ref 0–99)
NONHDL: 96.89
Triglycerides: 75 mg/dL (ref 0.0–149.0)
VLDL: 15 mg/dL (ref 0.0–40.0)

## 2017-05-10 LAB — CBC
HCT: 45.1 % (ref 39.0–52.0)
Hemoglobin: 16 g/dL (ref 13.0–17.0)
MCHC: 35.4 g/dL (ref 30.0–36.0)
MCV: 93.7 fl (ref 78.0–100.0)
Platelets: 215 10*3/uL (ref 150.0–400.0)
RBC: 4.82 Mil/uL (ref 4.22–5.81)
RDW: 12.3 % (ref 11.5–15.5)
WBC: 6 10*3/uL (ref 4.0–10.5)

## 2017-05-10 NOTE — Patient Instructions (Signed)
It was great to meet you today!  Take care and I will be in touch with your labs asap I did find documentation of a tetanus shot given in 07/2011- not here, must have been done elsewhere- so you do not need a booster today after all  Continue to exercise and take good care of yourself!     Health Maintenance, Male A healthy lifestyle and preventive care is important for your health and wellness. Ask your health care provider about what schedule of regular examinations is right for you. What should I know about weight and diet? Eat a Healthy Diet  Eat plenty of vegetables, fruits, whole grains, low-fat dairy products, and lean protein.  Do not eat a lot of foods high in solid fats, added sugars, or salt.  Maintain a Healthy Weight Regular exercise can help you achieve or maintain a healthy weight. You should:  Do at least 150 minutes of exercise each week. The exercise should increase your heart rate and make you sweat (moderate-intensity exercise).  Do strength-training exercises at least twice a week.  Watch Your Levels of Cholesterol and Blood Lipids  Have your blood tested for lipids and cholesterol every 5 years starting at 40 years of age. If you are at high risk for heart disease, you should start having your blood tested when you are 40 years old. You may need to have your cholesterol levels checked more often if: ? Your lipid or cholesterol levels are high. ? You are older than 39 years of age. ? You are at high risk for heart disease.  What should I know about cancer screening? Many types of cancers can be detected early and may often be prevented. Lung Cancer  You should be screened every year for lung cancer if: ? You are a current smoker who has smoked for at least 30 years. ? You are a former smoker who has quit within the past 15 years.  Talk to your health care provider about your screening options, when you should start screening, and how often you should be  screened.  Colorectal Cancer  Routine colorectal cancer screening usually begins at 40 years of age and should be repeated every 5-10 years until you are 40 years old. You may need to be screened more often if early forms of precancerous polyps or small growths are found. Your health care provider may recommend screening at an earlier age if you have risk factors for colon cancer.  Your health care provider may recommend using home test kits to check for hidden blood in the stool.  A small camera at the end of a tube can be used to examine your colon (sigmoidoscopy or colonoscopy). This checks for the earliest forms of colorectal cancer.  Prostate and Testicular Cancer  Depending on your age and overall health, your health care provider may do certain tests to screen for prostate and testicular cancer.  Talk to your health care provider about any symptoms or concerns you have about testicular or prostate cancer.  Skin Cancer  Check your skin from head to toe regularly.  Tell your health care provider about any new moles or changes in moles, especially if: ? There is a change in a mole's size, shape, or color. ? You have a mole that is larger than a pencil eraser.  Always use sunscreen. Apply sunscreen liberally and repeat throughout the day.  Protect yourself by wearing long sleeves, pants, a wide-brimmed hat, and sunglasses when outside.  What  should I know about heart disease, diabetes, and high blood pressure?  If you are 24-23 years of age, have your blood pressure checked every 3-5 years. If you are 43 years of age or older, have your blood pressure checked every year. You should have your blood pressure measured twice-once when you are at a hospital or clinic, and once when you are not at a hospital or clinic. Record the average of the two measurements. To check your blood pressure when you are not at a hospital or clinic, you can use: ? An automated blood pressure machine at a  pharmacy. ? A home blood pressure monitor.  Talk to your health care provider about your target blood pressure.  If you are between 31-73 years old, ask your health care provider if you should take aspirin to prevent heart disease.  Have regular diabetes screenings by checking your fasting blood sugar level. ? If you are at a normal weight and have a low risk for diabetes, have this test once every three years after the age of 28. ? If you are overweight and have a high risk for diabetes, consider being tested at a younger age or more often.  A one-time screening for abdominal aortic aneurysm (AAA) by ultrasound is recommended for men aged 17-75 years who are current or former smokers. What should I know about preventing infection? Hepatitis B If you have a higher risk for hepatitis B, you should be screened for this virus. Talk with your health care provider to find out if you are at risk for hepatitis B infection. Hepatitis C Blood testing is recommended for:  Everyone born from [birth year] through 1964.  Anyone with known risk factors for hepatitis C.  Sexually Transmitted Diseases (STDs)  You should be screened each year for STDs including gonorrhea and chlamydia if: ? You are sexually active and are younger than 40 years of age. ? You are older than 40 years of age and your health care provider tells you that you are at risk for this type of infection. ? Your sexual activity has changed since you were last screened and you are at an increased risk for chlamydia or gonorrhea. Ask your health care provider if you are at risk.  Talk with your health care provider about whether you are at high risk of being infected with HIV. Your health care provider may recommend a prescription medicine to help prevent HIV infection.  What else can I do?  Schedule regular health, dental, and eye exams.  Stay current with your vaccines (immunizations).  Do not use any tobacco products, such as  cigarettes, chewing tobacco, and e-cigarettes. If you need help quitting, ask your health care provider.  Limit alcohol intake to no more than 2 drinks per day. One drink equals 12 ounces of beer, 5 ounces of wine, or 1 ounces of hard liquor.  Do not use street drugs.  Do not share needles.  Ask your health care provider for help if you need support or information about quitting drugs.  Tell your health care provider if you often feel depressed.  Tell your health care provider if you have ever been abused or do not feel safe at home. This information is not intended to replace advice given to you by your health care provider. Make sure you discuss any questions you have with your health care provider. Document Released: 04/09/2008 Document Revised: 06/10/2016 Document Reviewed: 07/16/2015 Elsevier Interactive Patient Education  [employer].

## 2017-09-01 ENCOUNTER — Telehealth: Payer: Self-pay | Admitting: Family Medicine

## 2017-09-01 NOTE — Telephone Encounter (Signed)
Pt states will fax form for lab results from July 2018 visit to be filled out on [facility] form for his benefits.

## 2017-09-02 ENCOUNTER — Encounter: Payer: Self-pay | Admitting: Family Medicine

## 2017-09-03 NOTE — Telephone Encounter (Signed)
Physician Results Form received and completed as much as possible; forwarded to provider/SLS 11/09

## 2017-09-07 NOTE — Telephone Encounter (Signed)
Faxed. Pt notified 

## 2017-09-27 NOTE — Telephone Encounter (Signed)
Copy & Pasted from My Chart note/SLS:  Conversation: Non-Urgent Medical Question  (Newest Message First)  September 08, 2017  Copland, Leon Filler, MD  to Simmons, Leon   9:16 AM  Oh good- so glad it all went through! Take care    Last read by Leon Simmons at 9:28 AM on 09/08/2017.  Baer, Leon Simmons  to Copland, Leon Filler, MD   9:04 AM  Leon Simmons faxed over the document yesterday and everything worked out fine. Thanks so much for sending. I did have to sign it after all, so this worked out well. Have a great rest of the year!  Thanks,   September 07, 2017  Leon Simmons, CMA   4:59 PM  Note    Faxed. Pt notified.     Copland, Leon Filler, MD  to Leon Simmons, CMA   1:44 PM  HI T- his form is filled out and in my little file tray. Can you please fax it to him at [phone].  Then we can put in the scan box. Thank you!!  Copland, Leon Filler, MD  to Leon Simmons, Leon Simmons   1:43 PM  Hi Leon Simmons- will ask my nurse to fax the form to you today, so it should arrive this afternoon. You can sign it before you turn it in. All our scanning is done at a centralized location for the hospital- I'm afraid I am not technologically skilled enough to scan the form in myself and email it to you!  Take care  JC  Last read by Leon Simmons at 9:03 AM on 09/08/2017.  Leon Simmons, Leon Simmons  to Copland, Leon Filler, MD   8:18 AM  Thanks so much.  My fax is [phone]. It's a shared fax machine in the office, so please let me know when you send it so I can keep an eye out.  Or, if it's easier, could you scan it and send it in email to me so I may also have a copy? Then I can sign it and upload it also.  If you think I need to sign, I can come into the office today. Just let me know what you decide.  Thanks,  Leon Simmons   September 06, 2017  Copland, Leon Filler, MD  to Leon Simmons   4:33 PM  Thanks Leon Simmons- got it filled out. I will fax it in for you- however, there is a spot for you to sign the form as well. Do  you think you need to sign this before it is sent in? Not sure if this is a big deal or not  JC  Last read by Leon Simmons at 8:11 AM on 09/07/2017.  September 03, 2017  Me   9:04 AM  Note    Physician Results Form received and completed as much as possible; forwarded to provider/SLS 11/09    Leon Simmons, Leon Simmons  to Copland, Leon Filler, MD   7:28 AM  Thank you, Dr.  Here is the form. The fax looked like it was successful, but email seems to be the easier way anyway.  Leon Simmons   September 02, 2017  Copland, Leon Filler, MD  to Leon Simmons, Leon Simmons   6:19 PM  Hi Leon Simmons- I did not get the form yet but will be happy to fill it out. If there is anything we need for the form I will let you know. I will alert you when the form is ready- please let me know if you do not hear from me  JC  Last read by Leon Simmons at 7:26 AM on 09/03/2017.    3:22 PM  Leon Simmons, CMA routed this conversation to Copland, Leon Filler, MD  Girdler, Leon Simmons  to Copland, Leon Filler, MD   3:09 PM  I work at [employer] for preventive screens. I faxed you a form today and I hope that it is something you are able to fill in my July blood work numbers and a signature so that I can get some money in my HSA for the biometric screen?  Thanks,  Leon Simmons  [phone]

## 2017-11-16 ENCOUNTER — Telehealth: Payer: Self-pay | Admitting: Family Medicine

## 2017-11-16 NOTE — Telephone Encounter (Signed)
Patient Phy results form mailed to patient on 11/16/2017,Mailed to addess on file/ .

## 2018-07-31 NOTE — Progress Notes (Addendum)
[facility] [place], [place], [place] [address] [phone]  Date:  08/03/2018   Name:  Leon Simmons   DOB:  01-13-1976   MRN:  [id number]  PCP:  Darreld Mclean, MD    Chief Complaint: Annual Exam   History of Present Illness:  Leon Simmons is a 42 y.o. very pleasant male patient who presents with the following:  Here today for annual PE Generally in good health Last seen here about one year ago:  He donates platelets with the red cross His father was recently dx with primary sclerosing cholangitis- he was having belly pain.  His paternal aunt also died of liver failure in her 66s.  His parents live in [place], he went to [employer] in [place]. Moved here after college.  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  His son is 17, and his daughter is 47. They are starting 2nd and 1st grade this fall. They like their school  I take care of his wife Leon Simmons as well He enjoys golfing He does exercise on a regular basis; he lifts weights a couple of times a week at work and rides his bike He broke his right tibia at age 34 and gave up soccer at that time.   No issues with CP or SOB while exercising  Labs: due today Immun: flu- declined today   His kids are in 3rd and 4th grade this year. They go to the same school which is convenient Over the summer they did some weekend trips His only concern is that he has felt dizzy - lightheaded- more easily over the last several months.,  He will notice this when he gets up from bending over or squatting. May notice it when he is in his kettlebell class as he may be swinging the bell from low to high   No weight change He is otherwise feeling quite well He did see cardiology back in 2014 for palpations and all was well   BP Readings from Last 3 Encounters:  08/03/18 114/60  05/10/17 114/75  03/16/16 102/70   Wt Readings from Last 3  Encounters:  08/03/18 189 lb 9.6 oz (86 kg)  05/10/17 189 lb 12.8 oz (86.1 kg)  03/16/16 190 lb 3.2 oz (86.3 kg)    Patient Active Problem List   Diagnosis Date Noted  . Visit for preventive health examination 03/16/2016    Past Medical History:  Diagnosis Date  . Actinic keratosis   . Broken leg   . Phlebitis     Past Surgical History:  Procedure Laterality Date  . HAND SURGERY      Social History   Tobacco Use  . Smoking status: Never Smoker  . Smokeless tobacco: Never Used  Substance Use Topics  . Alcohol use: Yes    Alcohol/week: 0.0 standard drinks    Comment: rare  . Drug use: No    Family History  Problem Relation Age of Onset  . Heart disease Maternal Grandfather 60       Complications after a MI   . Cancer Paternal Grandfather 87       Prostate   . Arthritis Maternal Grandmother   . Healthy Child        x 2   . Heart disease Father   . Cancer Father        Colon Polyps  .  Cancer Paternal Uncle        Prostate 60    No Known Allergies  Medication list has been reviewed and updated.  Current Outpatient Medications on File Prior to Visit  Medication Sig Dispense Refill  . Multiple Vitamin (MULTIVITAMIN) tablet Take 1 tablet by mouth daily.     No current facility-administered medications on file prior to visit.     Review of Systems:  As per HPI- otherwise negative. He is still exercising- he is doing some running now No CP or SOB His grandfather did have prostate cancer, dx in his mis 22s Physical Examination: Vitals:   08/03/18 0810  BP: 114/60  Pulse: 68  Resp: 16  Temp: 98.7 F (37.1 C)  SpO2: 97%   Vitals:   08/03/18 0810  Weight: 189 lb 9.6 oz (86 kg)  Height: 6' 1.5" (1.867 m)   Body mass index is 24.68 kg/m. Ideal Body Weight: Weight in (lb) to have BMI = 25: 191.7  GEN: WDWN, NAD, Non-toxic, A & O x 3, fit build, looks well  HEENT: Atraumatic, Normocephalic. Neck supple. No masses, No LAD.  Bilateral TM wnl,  oropharynx normal.  PEERL,EOMI.   Ears and Nose: No external deformity. CV: RRR, No M/G/R. No JVD. No thrill. No extra heart sounds. PULM: CTA B, no wheezes, crackles, rhonchi. No retractions. No resp. distress. No accessory muscle use. ABD: S, NT, ND. No rebound. No HSM. EXTR: No c/c/e NEURO Normal gait.  Normal strength and DTR of all extremities  PSYCH: Normally interactive. Conversant. Not depressed or anxious appearing.  Calm demeanor.   EKG: mild sinus bradycardia, ow normal     Assessment and Plan: Physical exam  Screening for deficiency anemia - Plan: CBC  Screening for diabetes mellitus - Plan: Comprehensive metabolic panel, Hemoglobin A1c  Screening for hyperlipidemia - Plan: Lipid panel  Orthostatic hypotension - Plan: EKG 12-Lead  CPE today overal he is doing fine He has noted what sounds like orthostatic hypotension sx when he gets up from bending or squatting. This is not unexpected considering his tall and thin build EKG ok today Orthostatic VS ok today, he did increase his pulse by about 10 BPM laying to standing  Discussed ways to avoid these sx such as getting up more slowly, hydration, increased salt intake.  He will let me know if this gets worse or changes in any other say   Signed Lamar Blinks, MD  Received labs, message to pt  Results for orders placed or performed in visit on 08/03/18  CBC  Result Value Ref Range   WBC 4.3 4.0 - 10.5 K/uL   RBC 4.57 4.22 - 5.81 Mil/uL   Platelets 229.0 150.0 - 400.0 K/uL   Hemoglobin 14.9 13.0 - 17.0 g/dL   HCT 43.3 39.0 - 52.0 %   MCV 94.7 78.0 - 100.0 fl   MCHC 34.3 30.0 - 36.0 g/dL   RDW 12.6 11.5 - 15.5 %  Comprehensive metabolic panel  Result Value Ref Range   Sodium 141 135 - 145 mEq/L   Potassium 4.7 3.5 - 5.1 mEq/L   Chloride 104 96 - 112 mEq/L   CO2 33 (H) 19 - 32 mEq/L   Glucose, Bld 100 (H) 70 - 99 mg/dL   BUN 16 6 - 23 mg/dL   Creatinine, Ser 0.97 0.40 - 1.50 mg/dL   Total Bilirubin 0.6  0.2 - 1.2 mg/dL   Alkaline Phosphatase 50 39 - 117 U/L   AST 14 0 -  37 U/L   ALT 11 0 - 53 U/L   Total Protein 6.8 6.0 - 8.3 g/dL   Albumin 4.3 3.5 - 5.2 g/dL   Calcium 9.8 8.4 - 10.5 mg/dL   GFR 90.41 >60.00 mL/min  Hemoglobin A1c  Result Value Ref Range   Hgb A1c MFr Bld 5.2 4.6 - 6.5 %  Lipid panel  Result Value Ref Range   Cholesterol 145 0 - 200 mg/dL   Triglycerides 69.0 0.0 - 149.0 mg/dL   HDL 45.10 >39.00 mg/dL   VLDL 13.8 0.0 - 40.0 mg/dL   LDL Cholesterol 86 0 - 99 mg/dL   Total CHOL/HDL Ratio 3    NonHDL 99.72

## 2018-08-03 ENCOUNTER — Encounter: Payer: Self-pay | Admitting: Family Medicine

## 2018-08-03 ENCOUNTER — Ambulatory Visit (INDEPENDENT_AMBULATORY_CARE_PROVIDER_SITE_OTHER): Payer: 59 | Admitting: Family Medicine

## 2018-08-03 VITALS — Temp 98.7°F | Resp 16 | Ht 73.5 in | Wt 189.6 lb

## 2018-08-03 DIAGNOSIS — Z13220 Encounter for screening for lipoid disorders: Secondary | ICD-10-CM | POA: Diagnosis not present

## 2018-08-03 DIAGNOSIS — Z131 Encounter for screening for diabetes mellitus: Secondary | ICD-10-CM

## 2018-08-03 DIAGNOSIS — Z130 Encounter for screening for diseases of the blood and blood-forming organs and certain disorders involving the immune mechanism: Secondary | ICD-10-CM | POA: Diagnosis not present

## 2018-08-03 DIAGNOSIS — I951 Orthostatic hypotension: Secondary | ICD-10-CM

## 2018-08-03 DIAGNOSIS — Z0000 Encounter for general adult medical examination without abnormal findings: Secondary | ICD-10-CM | POA: Diagnosis not present

## 2018-08-03 LAB — COMPREHENSIVE METABOLIC PANEL
ALT: 11 U/L (ref 0–53)
AST: 14 U/L (ref 0–37)
Albumin: 4.3 g/dL (ref 3.5–5.2)
Alkaline Phosphatase: 50 U/L (ref 39–117)
BILIRUBIN TOTAL: 0.6 mg/dL (ref 0.2–1.2)
BUN: 16 mg/dL (ref 6–23)
CO2: 33 mEq/L — ABNORMAL HIGH (ref 19–32)
Calcium: 9.8 mg/dL (ref 8.4–10.5)
Chloride: 104 mEq/L (ref 96–112)
Creatinine, Ser: 0.97 mg/dL (ref 0.40–1.50)
GFR: 90.41 mL/min (ref >60.00)
GLUCOSE: 100 mg/dL — AB (ref 70–99)
Potassium: 4.7 mEq/L (ref 3.5–5.1)
SODIUM: 141 meq/L (ref 135–145)
TOTAL PROTEIN: 6.8 g/dL (ref 6.0–8.3)

## 2018-08-03 LAB — CBC
HCT: 43.3 % (ref 39.0–52.0)
HEMOGLOBIN: 14.9 g/dL (ref 13.0–17.0)
MCHC: 34.3 g/dL (ref 30.0–36.0)
MCV: 94.7 fl (ref 78.0–100.0)
Platelets: 229 10*3/uL (ref 150.0–400.0)
RBC: 4.57 Mil/uL (ref 4.22–5.81)
RDW: 12.6 % (ref 11.5–15.5)
WBC: 4.3 10*3/uL (ref 4.0–10.5)

## 2018-08-03 LAB — LIPID PANEL
Cholesterol: 145 mg/dL (ref 0–200)
HDL: 45.1 mg/dL (ref >39.00)
LDL Cholesterol: 86 mg/dL (ref 0–99)
NONHDL: 99.72
Total CHOL/HDL Ratio: 3
Triglycerides: 69 mg/dL (ref 0.0–149.0)
VLDL: 13.8 mg/dL (ref 0.0–40.0)

## 2018-08-03 LAB — HEMOGLOBIN A1C: Hgb A1c MFr Bld: 5.2 % (ref 4.6–6.5)

## 2018-08-03 NOTE — Patient Instructions (Addendum)
Great to see you as always!  I will be in touch with your labs asap  Try rising more slowly if you have been squatting or bent over to avoid feeing dizzy.  Hydration and increased salt intake can also help. Let me know if this continues to be an issue for you, or if getting worse!  You did ok on your BP and pulse testing today   Health Maintenance, Male A healthy lifestyle and preventive care is important for your health and wellness. Ask your health care provider about what schedule of regular examinations is right for you. What should I know about weight and diet? Eat a Healthy Diet  Eat plenty of vegetables, fruits, whole grains, low-fat dairy products, and lean protein.  Do not eat a lot of foods high in solid fats, added sugars, or salt.  Maintain a Healthy Weight Regular exercise can help you achieve or maintain a healthy weight. You should:  Do at least 150 minutes of exercise each week. The exercise should increase your heart rate and make you sweat (moderate-intensity exercise).  Do strength-training exercises at least twice a week.  Watch Your Levels of Cholesterol and Blood Lipids  Have your blood tested for lipids and cholesterol every 5 years starting at 41 years of age. If you are at high risk for heart disease, you should start having your blood tested when you are 41 years old. You may need to have your cholesterol levels checked more often if: ? Your lipid or cholesterol levels are high. ? You are older than 41 years of age. ? You are at high risk for heart disease.  What should I know about cancer screening? Many types of cancers can be detected early and may often be prevented. Lung Cancer  You should be screened every year for lung cancer if: ? You are a current smoker who has smoked for at least 30 years. ? You are a former smoker who has quit within the past 15 years.  Talk to your health care provider about your screening options, when you should start  screening, and how often you should be screened.  Colorectal Cancer  Routine colorectal cancer screening usually begins at 41 years of age and should be repeated every 5-10 years until you are 41 years old. You may need to be screened more often if early forms of precancerous polyps or small growths are found. Your health care provider may recommend screening at an earlier age if you have risk factors for colon cancer.  Your health care provider may recommend using home test kits to check for hidden blood in the stool.  A small camera at the end of a tube can be used to examine your colon (sigmoidoscopy or colonoscopy). This checks for the earliest forms of colorectal cancer.  Prostate and Testicular Cancer  Depending on your age and overall health, your health care provider may do certain tests to screen for prostate and testicular cancer.  Talk to your health care provider about any symptoms or concerns you have about testicular or prostate cancer.  Skin Cancer  Check your skin from head to toe regularly.  Tell your health care provider about any new moles or changes in moles, especially if: ? There is a change in a mole's size, shape, or color. ? You have a mole that is larger than a pencil eraser.  Always use sunscreen. Apply sunscreen liberally and repeat throughout the day.  Protect yourself by wearing long sleeves, pants,  a wide-brimmed hat, and sunglasses when outside.  What should I know about heart disease, diabetes, and high blood pressure?  If you are 20-4 years of age, have your blood pressure checked every 3-5 years. If you are 71 years of age or older, have your blood pressure checked every year. You should have your blood pressure measured twice-once when you are at a hospital or clinic, and once when you are not at a hospital or clinic. Record the average of the two measurements. To check your blood pressure when you are not at a hospital or clinic, you can use: ? An  automated blood pressure machine at a pharmacy. ? A home blood pressure monitor.  Talk to your health care provider about your target blood pressure.  If you are between 50-31 years old, ask your health care provider if you should take aspirin to prevent heart disease.  Have regular diabetes screenings by checking your fasting blood sugar level. ? If you are at a normal weight and have a low risk for diabetes, have this test once every three years after the age of 44. ? If you are overweight and have a high risk for diabetes, consider being tested at a younger age or more often.  A one-time screening for abdominal aortic aneurysm (AAA) by ultrasound is recommended for men aged 78-75 years who are current or former smokers. What should I know about preventing infection? Hepatitis B If you have a higher risk for hepatitis B, you should be screened for this virus. Talk with your health care provider to find out if you are at risk for hepatitis B infection. Hepatitis C Blood testing is recommended for:  Everyone born from [birth year] through 1965.  Anyone with known risk factors for hepatitis C.  Sexually Transmitted Diseases (STDs)  You should be screened each year for STDs including gonorrhea and chlamydia if: ? You are sexually active and are younger than 41 years of age. ? You are older than 41 years of age and your health care provider tells you that you are at risk for this type of infection. ? Your sexual activity has changed since you were last screened and you are at an increased risk for chlamydia or gonorrhea. Ask your health care provider if you are at risk.  Talk with your health care provider about whether you are at high risk of being infected with HIV. Your health care provider may recommend a prescription medicine to help prevent HIV infection.  What else can I do?  Schedule regular health, dental, and eye exams.  Stay current with your vaccines (immunizations).  Do not use  any tobacco products, such as cigarettes, chewing tobacco, and e-cigarettes. If you need help quitting, ask your health care provider.  Limit alcohol intake to no more than 2 drinks per day. One drink equals 12 ounces of beer, 5 ounces of wine, or 1 ounces of hard liquor.  Do not use street drugs.  Do not share needles.  Ask your health care provider for help if you need support or information about quitting drugs.  Tell your health care provider if you often feel depressed.  Tell your health care provider if you have ever been abused or do not feel safe at home. This information is not intended to replace advice given to you by your health care provider. Make sure you discuss any questions you have with your health care provider. Document Released: 04/09/2008 Document Revised: 06/10/2016 Document Reviewed: 07/16/2015 Elsevier Interactive  Patient Education  [employer].

## 2019-07-25 NOTE — Progress Notes (Addendum)
[place] [facility], [place], [place] [address] [phone]  Date:  07/27/2019   Name:  Leon Simmons   DOB:  May 18, 1976   MRN:  [id number]  PCP:  Darreld Mclean, MD    Chief Complaint: Follow-up (lab work)   History of Present Illness:  Leon Simmons is a 43 y.o. 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   He enjoys golf, also exercises by biking and lifting weights He is able to play golf some, a tourney this weekend  He is working from home which is ok- he does go into the office on occasion  No CP or SOB He tends to notice dry skin and   Waist measurement:  32 in  His GF had prostate cancer in his 84s.  His dad is ok so far No family history of colon cancer  Patient Active Problem List   Diagnosis Date Noted  . Visit for preventive health examination 03/16/2016    Past Medical History:  Diagnosis Date  . Actinic keratosis   . Broken leg   . Phlebitis     Past Surgical History:  Procedure Laterality Date  . HAND SURGERY      Social History   Tobacco Use  . Smoking status: Never Smoker  . Smokeless tobacco: Never Used  Substance Use Topics  . Alcohol use: Yes    Alcohol/week: 0.0 standard drinks    Comment: rare  . Drug use: No    Family History  Problem Relation Age of Onset  . Heart disease Maternal Grandfather 60       Complications after a MI   . Cancer Paternal Grandfather 80       Prostate   . Arthritis Maternal Grandmother   . Healthy Child        x 2   . Heart disease Father   . Cancer Father        Colon Polyps  . Cancer Paternal  Uncle        Prostate 60    No Known Allergies  Medication list has been reviewed and updated.  Current Outpatient Medications on File Prior to Visit  Medication Sig Dispense Refill  . Multiple Vitamin (MULTIVITAMIN) tablet Take 1 tablet by mouth daily.     No current facility-administered medications on file prior to visit.     Review of Systems:  As per HPI- otherwise negative. No CP or SOB No testicular lumps or bumps   Physical Examination: Vitals:   07/27/19 0918  BP: 102/60  Pulse: 69  Resp: 16  Temp: (!) 96.1 F (35.6 C)  SpO2: 96%   Vitals:   07/27/19 0918  Weight: 184 lb (83.5 kg)  Height: 6' 1.5" (1.867 m)   Body mass index is 23.95 kg/m. Ideal Body Weight: Weight in (lb) to have BMI = 25: 191.7  GEN: WDWN, NAD, Non-toxic, A & O x 3, looks well, normal weight  HEENT: Atraumatic, Normocephalic. Neck supple. No masses, No LAD.  TM wnl  Ears and Nose: No external deformity. CV: RRR, No M/G/R. No JVD.  No thrill. No extra heart sounds. PULM: CTA B, no wheezes, crackles, rhonchi. No retractions. No resp. distress. No accessory muscle use. ABD: S, NT, ND, +BS. No rebound. No HSM. EXTR: No c/c/e NEURO Normal gait.  PSYCH: Normally interactive. Conversant. Not depressed or anxious appearing.  Calm demeanor.   BP Readings from Last 3 Encounters:  07/27/19 102/60  05/10/17 114/75  03/16/16 102/70    Assessment and Plan: Physical exam  Screening for deficiency anemia - Plan: CBC  Screening for diabetes mellitus - Plan: Comprehensive metabolic panel, Hemoglobin A1c  Screening for hyperlipidemia - Plan: Lipid panel  Generally in good health Exercises on a regular basis Labs pending as above He has noted some thinning of his skin like his dad.  Advised to limit sun exposure  Will plan further follow- up pending labs.   Signed Lamar Blinks, MD  Received his labs as below, message to patient Results for orders placed or performed in visit on  07/27/19  CBC  Result Value Ref Range   WBC 5.4 4.0 - 10.5 K/uL   RBC 4.95 4.22 - 5.81 Mil/uL   Platelets 224.0 150.0 - 400.0 K/uL   Hemoglobin 16.3 13.0 - 17.0 g/dL   HCT 47.6 39.0 - 52.0 %   MCV 96.2 78.0 - 100.0 fl   MCHC 34.3 30.0 - 36.0 g/dL   RDW 11.8 11.5 - 15.5 %  Comprehensive metabolic panel  Result Value Ref Range   Sodium 139 135 - 145 mEq/L   Potassium 4.5 3.5 - 5.1 mEq/L   Chloride 102 96 - 112 mEq/L   CO2 30 19 - 32 mEq/L   Glucose, Bld 94 70 - 99 mg/dL   BUN 15 6 - 23 mg/dL   Creatinine, Ser 1.01 0.40 - 1.50 mg/dL   Total Bilirubin 0.9 0.2 - 1.2 mg/dL   Alkaline Phosphatase 62 39 - 117 U/L   AST 18 0 - 37 U/L   ALT 17 0 - 53 U/L   Total Protein 7.1 6.0 - 8.3 g/dL   Albumin 4.7 3.5 - 5.2 g/dL   Calcium 9.9 8.4 - 10.5 mg/dL   GFR 80.81 >60.00 mL/min  Hemoglobin A1c  Result Value Ref Range   Hgb A1c MFr Bld 5.3 4.6 - 6.5 %  Lipid panel  Result Value Ref Range   Cholesterol 162 0 - 200 mg/dL   Triglycerides 71.0 0.0 - 149.0 mg/dL   HDL 43.80 >39.00 mg/dL   VLDL 14.2 0.0 - 40.0 mg/dL   LDL Cholesterol 104 (H) 0 - 99 mg/dL   Total CHOL/HDL Ratio 4    NonHDL 117.85

## 2019-07-26 ENCOUNTER — Other Ambulatory Visit: Payer: Self-pay

## 2019-07-27 ENCOUNTER — Encounter: Payer: Self-pay | Admitting: Family Medicine

## 2019-07-27 ENCOUNTER — Ambulatory Visit (INDEPENDENT_AMBULATORY_CARE_PROVIDER_SITE_OTHER): Payer: 59 | Admitting: Family Medicine

## 2019-07-27 VITALS — BP 102/60 | HR 69 | Temp 96.1°F | Resp 16 | Ht 73.5 in | Wt 184.0 lb

## 2019-07-27 DIAGNOSIS — Z13220 Encounter for screening for lipoid disorders: Secondary | ICD-10-CM

## 2019-07-27 DIAGNOSIS — Z131 Encounter for screening for diabetes mellitus: Secondary | ICD-10-CM | POA: Diagnosis not present

## 2019-07-27 DIAGNOSIS — Z130 Encounter for screening for diseases of the blood and blood-forming organs and certain disorders involving the immune mechanism: Secondary | ICD-10-CM | POA: Diagnosis not present

## 2019-07-27 DIAGNOSIS — Z0000 Encounter for general adult medical examination without abnormal findings: Secondary | ICD-10-CM | POA: Diagnosis not present

## 2019-07-27 LAB — COMPREHENSIVE METABOLIC PANEL
ALT: 17 U/L (ref 0–53)
AST: 18 U/L (ref 0–37)
Albumin: 4.7 g/dL (ref 3.5–5.2)
Alkaline Phosphatase: 62 U/L (ref 39–117)
BUN: 15 mg/dL (ref 6–23)
CO2: 30 mEq/L (ref 19–32)
Calcium: 9.9 mg/dL (ref 8.4–10.5)
Chloride: 102 mEq/L (ref 96–112)
Creatinine, Ser: 1.01 mg/dL (ref 0.40–1.50)
GFR: 80.81 mL/min (ref >60.00)
Glucose, Bld: 94 mg/dL (ref 70–99)
Potassium: 4.5 mEq/L (ref 3.5–5.1)
Sodium: 139 mEq/L (ref 135–145)
Total Bilirubin: 0.9 mg/dL (ref 0.2–1.2)
Total Protein: 7.1 g/dL (ref 6.0–8.3)

## 2019-07-27 LAB — CBC
HCT: 47.6 % (ref 39.0–52.0)
Hemoglobin: 16.3 g/dL (ref 13.0–17.0)
MCHC: 34.3 g/dL (ref 30.0–36.0)
MCV: 96.2 fl (ref 78.0–100.0)
Platelets: 224 10*3/uL (ref 150.0–400.0)
RBC: 4.95 Mil/uL (ref 4.22–5.81)
RDW: 11.8 % (ref 11.5–15.5)
WBC: 5.4 10*3/uL (ref 4.0–10.5)

## 2019-07-27 LAB — LIPID PANEL
Cholesterol: 162 mg/dL (ref 0–200)
HDL: 43.8 mg/dL (ref >39.00)
LDL Cholesterol: 104 mg/dL — ABNORMAL HIGH (ref 0–99)
NonHDL: 117.85
Total CHOL/HDL Ratio: 4
Triglycerides: 71 mg/dL (ref 0.0–149.0)
VLDL: 14.2 mg/dL (ref 0.0–40.0)

## 2019-07-27 LAB — HEMOGLOBIN A1C: Hgb A1c MFr Bld: 5.3 % (ref 4.6–6.5)

## 2019-07-27 NOTE — Patient Instructions (Signed)
Great to see you again today- enjoy the fall!  I will be in touch with your labs asap and can fill out a form if needed  For skin, try to avoid excessive sun. A B-complex vitamin with biotin *may* be helpful for your skin   Health Maintenance, Male Adopting a healthy lifestyle and getting preventive care are important in promoting health and wellness. Ask your health care provider about:  The right schedule for you to have regular tests and exams.  Things you can do on your own to prevent diseases and keep yourself healthy. What should I know about diet, weight, and exercise? Eat a healthy diet   Eat a diet that includes plenty of vegetables, fruits, low-fat dairy products, and lean protein.  Do not eat a lot of foods that are high in solid fats, added sugars, or sodium. Maintain a healthy weight Body mass index (BMI) is a measurement that can be used to identify possible weight problems. It estimates body fat based on height and weight. Your health care provider can help determine your BMI and help you achieve or maintain a healthy weight. Get regular exercise Get regular exercise. This is one of the most important things you can do for your health. Most adults should:  Exercise for at least 150 minutes each week. The exercise should increase your heart rate and make you sweat (moderate-intensity exercise).  Do strengthening exercises at least twice a week. This is in addition to the moderate-intensity exercise.  Spend less time sitting. Even light physical activity can be beneficial. Watch cholesterol and blood lipids Have your blood tested for lipids and cholesterol at 42 years of age, then have this test every 5 years. You may need to have your cholesterol levels checked more often if:  Your lipid or cholesterol levels are high.  You are older than 42 years of age.  You are at high risk for heart disease. What should I know about cancer screening? Many types of cancers can  be detected early and may often be prevented. Depending on your health history and family history, you may need to have cancer screening at various ages. This may include screening for:  Colorectal cancer.  Prostate cancer.  Skin cancer.  Lung cancer. What should I know about heart disease, diabetes, and high blood pressure? Blood pressure and heart disease  High blood pressure causes heart disease and increases the risk of stroke. This is more likely to develop in people who have high blood pressure readings, are of African descent, or are overweight.  Talk with your health care provider about your target blood pressure readings.  Have your blood pressure checked: ? Every 3-5 years if you are 78-58 years of age. ? Every year if you are 66 years old or older.  If you are between the ages of 46 and 72 and are a current or former smoker, ask your health care provider if you should have a one-time screening for abdominal aortic aneurysm (AAA). Diabetes Have regular diabetes screenings. This checks your fasting blood sugar level. Have the screening done:  Once every three years after age 35 if you are at a normal weight and have a low risk for diabetes.  More often and at a younger age if you are overweight or have a high risk for diabetes. What should I know about preventing infection? Hepatitis B If you have a higher risk for hepatitis B, you should be screened for this virus. Talk with your health  care provider to find out if you are at risk for hepatitis B infection. Hepatitis C Blood testing is recommended for:  Everyone born from [birth year] through 1965.  Anyone with known risk factors for hepatitis C. Sexually transmitted infections (STIs)  You should be screened each year for STIs, including gonorrhea and chlamydia, if: ? You are sexually active and are younger than 42 years of age. ? You are older than 42 years of age and your health care provider tells you that you are at risk  for this type of infection. ? Your sexual activity has changed since you were last screened, and you are at increased risk for chlamydia or gonorrhea. Ask your health care provider if you are at risk.  Ask your health care provider about whether you are at high risk for HIV. Your health care provider may recommend a prescription medicine to help prevent HIV infection. If you choose to take medicine to prevent HIV, you should first get tested for HIV. You should then be tested every 3 months for as long as you are taking the medicine. Follow these instructions at home: Lifestyle  Do not use any products that contain nicotine or tobacco, such as cigarettes, e-cigarettes, and chewing tobacco. If you need help quitting, ask your health care provider.  Do not use street drugs.  Do not share needles.  Ask your health care provider for help if you need support or information about quitting drugs. Alcohol use  Do not drink alcohol if your health care provider tells you not to drink.  If you drink alcohol: ? Limit how much you have to 0-2 drinks a day. ? Be aware of how much alcohol is in your drink. In the [place] one drink equals one 12 oz bottle of beer (355 mL), one 5 oz glass of wine (148 mL), or one 1 oz glass of hard liquor (44 mL). General instructions  Schedule regular health, dental, and eye exams.  Stay current with your vaccines.  Tell your health care provider if: ? You often feel depressed. ? You have ever been abused or do not feel safe at home. Summary  Adopting a healthy lifestyle and getting preventive care are important in promoting health and wellness.  Follow your health care provider's instructions about healthy diet, exercising, and getting tested or screened for diseases.  Follow your health care provider's instructions on monitoring your cholesterol and blood pressure. This information is not intended to replace advice given to you by your health care provider.  Make sure you discuss any questions you have with your health care provider. Document Released: 04/09/2008 Document Revised: 10/05/2018 Document Reviewed: 10/05/2018 Elsevier Patient Education  2020 [employer].

## 2019-08-23 ENCOUNTER — Encounter: Payer: Self-pay | Admitting: Family Medicine

## 2019-08-24 ENCOUNTER — Telehealth: Payer: Self-pay | Admitting: Family Medicine

## 2019-08-24 NOTE — Telephone Encounter (Signed)
Pt dropped off document to be filled out by provider (Physician Results 3 pages) Pt stated is needing document if possible before 10-31, (pt was informed document takes 5-7 business days. Please call pt at [phone] when ready to pick up. Document given to CMA.

## 2019-08-24 NOTE — Telephone Encounter (Signed)
Forms filled out and placed up front for pick up.

## 2019-08-24 NOTE — Telephone Encounter (Signed)
Forms have been placed up front for patient pick up

## 2020-06-03 NOTE — Patient Instructions (Addendum)
It was great to see you again today! Please get an x-ray for your big toe at your convenience at [place]- you can just walk in [address] further follow- up pending labs.    Health Maintenance, Male Adopting a healthy lifestyle and getting preventive care are important in promoting health and wellness. Ask your health care provider about:  The right schedule for you to have regular tests and exams.  Things you can do on your own to prevent diseases and keep yourself healthy. What should I know about diet, weight, and exercise? Eat a healthy diet   Eat a diet that includes plenty of vegetables, fruits, low-fat dairy products, and lean protein.  Do not eat a lot of foods that are high in solid fats, added sugars, or sodium. Maintain a healthy weight Body mass index (BMI) is a measurement that can be used to identify possible weight problems. It estimates body fat based on height and weight. Your health care provider can help determine your BMI and help you achieve or maintain a healthy weight. Get regular exercise Get regular exercise. This is one of the most important things you can do for your health. Most adults should:  Exercise for at least 150 minutes each week. The exercise should increase your heart rate and make you sweat (moderate-intensity exercise).  Do strengthening exercises at least twice a week. This is in addition to the moderate-intensity exercise.  Spend less time sitting. Even light physical activity can be beneficial. Watch cholesterol and blood lipids Have your blood tested for lipids and cholesterol at 43 years of age, then have this test every 5 years. You may need to have your cholesterol levels checked more often if:  Your lipid or cholesterol levels are high.  You are older than 43 years of age.  You are at high risk for heart disease. What should I know about cancer screening? Many types of cancers can be detected  early and may often be prevented. Depending on your health history and family history, you may need to have cancer screening at various ages. This may include screening for:  Colorectal cancer.  Prostate cancer.  Skin cancer.  Lung cancer. What should I know about heart disease, diabetes, and high blood pressure? Blood pressure and heart disease  High blood pressure causes heart disease and increases the risk of stroke. This is more likely to develop in people who have high blood pressure readings, are of African descent, or are overweight.  Talk with your health care provider about your target blood pressure readings.  Have your blood pressure checked: ? Every 3-5 years if you are 69-31 years of age. ? Every year if you are 60 years old or older.  If you are between the ages of 86 and 8 and are a current or former smoker, ask your health care provider if you should have a one-time screening for abdominal aortic aneurysm (AAA). Diabetes Have regular diabetes screenings. This checks your fasting blood sugar level. Have the screening done:  Once every three years after age 72 if you are at a normal weight and have a low risk for diabetes.  More often and at a younger age if you are overweight or have a high risk for diabetes. What should I know about preventing infection? Hepatitis B If you have a higher risk for hepatitis B, you should be screened for this virus. Talk with your health care provider to find out if  you are at risk for hepatitis B infection. Hepatitis C Blood testing is recommended for:  Everyone born from [birth year] through 1964.  Anyone with known risk factors for hepatitis C. Sexually transmitted infections (STIs)  You should be screened each year for STIs, including gonorrhea and chlamydia, if: ? You are sexually active and are younger than 43 years of age. ? You are older than 43 years of age and your health care provider tells you that you are at risk for this  type of infection. ? Your sexual activity has changed since you were last screened, and you are at increased risk for chlamydia or gonorrhea. Ask your health care provider if you are at risk.  Ask your health care provider about whether you are at high risk for HIV. Your health care provider may recommend a prescription medicine to help prevent HIV infection. If you choose to take medicine to prevent HIV, you should first get tested for HIV. You should then be tested every 3 months for as long as you are taking the medicine. Follow these instructions at home: Lifestyle  Do not use any products that contain nicotine or tobacco, such as cigarettes, e-cigarettes, and chewing tobacco. If you need help quitting, ask your health care provider.  Do not use street drugs.  Do not share needles.  Ask your health care provider for help if you need support or information about quitting drugs. Alcohol use  Do not drink alcohol if your health care provider tells you not to drink.  If you drink alcohol: ? Limit how much you have to 0-2 drinks a day. ? Be aware of how much alcohol is in your drink. In the [place] one drink equals one 12 oz bottle of beer (355 mL), one 5 oz glass of wine (148 mL), or one 1 oz glass of hard liquor (44 mL). General instructions  Schedule regular health, dental, and eye exams.  Stay current with your vaccines.  Tell your health care provider if: ? You often feel depressed. ? You have ever been abused or do not feel safe at home. Summary  Adopting a healthy lifestyle and getting preventive care are important in promoting health and wellness.  Follow your health care provider's instructions about healthy diet, exercising, and getting tested or screened for diseases.  Follow your health care provider's instructions on monitoring your cholesterol and blood pressure. This information is not intended to replace advice given to you by your health care provider. Make sure  you discuss any questions you have with your health care provider. Document Revised: 10/05/2018 Document Reviewed: 10/05/2018 Elsevier Patient Education  2020 [employer].

## 2020-06-03 NOTE — Progress Notes (Addendum)
[facility] at [facility] [place], [place], [place] [address] [phone]  Date:  06/05/2020   Name:  Leon Simmons   DOB:  1976-02-04   MRN:  [id number]  PCP:  Darreld Mclean, MD    Chief Complaint: Annual Exam   History of Present Illness:  Leon Simmons is a 44 y.o. very pleasant male patient who presents with the following:  Generally healthy young man here today for physical exam Last seen by myself for physical in October 2020  Married to Leon Simmons, his children will be in fifth and sixth grades this year- they go to the same school He enjoys golf, biking and weightlifting- he is still getting plenty of exercise   Most recent labs October 2020- he is fasting now Covid- done already He does donate platelets and plasma on a regular basis   No chest pain or shortness of breath with exercise He may sometimes feel faint if his blood sugar gets too low, this is resolved by eating   He did stub his right great toe about 5-6 weeks ago -it was quite swollen and painful, he wonders if it might have been broken Difficult to bend at the DIP joint It is still a bit tender and swollen Does not hurt to walk  Patient Active Problem List   Diagnosis Date Noted  . Visit for preventive health examination 03/16/2016    Past Medical History:  Diagnosis Date  . Actinic keratosis   . Broken leg   . Phlebitis     Past Surgical History:  Procedure Laterality Date  . HAND SURGERY      Social History   Tobacco Use  . Smoking status: Never Smoker  . Smokeless tobacco: Never Used  Substance Use Topics  . Alcohol use: Yes    Alcohol/week: 0.0 standard drinks    Comment: rare  . Drug use: No    Family History  Problem Relation Age of Onset  . Heart disease Maternal Grandfather 60       Complications after a MI   . Cancer Paternal Grandfather 17       Prostate   . Arthritis Maternal Grandmother   . Healthy Child        x 2    . Heart disease Father   . Cancer Father        Colon Polyps  . Cancer Paternal Uncle        Prostate 60    No Known Allergies  Medication list has been reviewed and updated.  Current Outpatient Medications on File Prior to Visit  Medication Sig Dispense Refill  . Multiple Vitamin (MULTIVITAMIN) tablet Take 1 tablet by mouth daily.     No current facility-administered medications on file prior to visit.    Review of Systems:  As per HPI- otherwise negative.   Physical Examination: Vitals:   06/05/20 1316  BP: 110/68  Pulse: 72  Resp: 16  Temp: 97.9 F (36.6 C)  SpO2: 98%   Vitals:   06/05/20 1316  Weight: 186 lb (84.4 kg)  Height: 6\' 2"  (1.88 m)   Body mass index is 23.88 kg/m. Ideal Body Weight: Weight in (lb) to have BMI = 25: 194.3  GEN: no acute distress.  Slim build, looks well HEENT: Atraumatic, Normocephalic.   Bilateral TM wnl, oropharynx normal.  PEERL,EOMI.   Ears and Nose: No external deformity. CV: RRR, No M/G/R. No JVD. No thrill.  No extra heart sounds. PULM: CTA B, no wheezes, crackles, rhonchi. No retractions. No resp. distress. No accessory muscle use. ABD: S, NT, ND. No rebound. No HSM. EXTR: No c/c/e PSYCH: Normally interactive. Conversant.  Right great toe: mild residual swelling still present. Mildly tender.  Poor movement at the IP joint   Assessment and Plan: Physical exam  Screening for deficiency anemia - Plan: CBC  Screening for diabetes mellitus - Plan: Comprehensive metabolic panel, Hemoglobin A1c  Screening for hyperlipidemia - Plan: Lipid panel  Encounter for hepatitis C screening test for low risk patient - Plan: Hepatitis C antibody  Injury of right great toe, initial encounter - Plan: DG Toe Great Right  CPE today Labs pending as above Continue good diet and exercise habits Never a smoker, little alcohol  Will plan further follow- up pending labs. Will obtain x-ray of great toe to determine if any fracture    This visit occurred during the SARS-CoV-2 public health emergency.  Safety protocols were in place, including screening questions prior to the visit, additional usage of staff PPE, and extensive cleaning of exam room while observing appropriate contact time as indicated for disinfecting solutions.    Signed Lamar Blinks, MD  Received his labs as below, message to patient  Results for orders placed or performed in visit on 06/05/20  CBC  Result Value Ref Range   WBC 5.2 4.0 - 10.5 K/uL   RBC 4.49 4.22 - 5.81 Mil/uL   Platelets 182.0 150 - 400 K/uL   Hemoglobin 14.7 13.0 - 17.0 g/dL   HCT 42.4 39 - 52 %   MCV 94.4 78.0 - 100.0 fl   MCHC 34.8 30.0 - 36.0 g/dL   RDW 12.0 11.5 - 15.5 %  Comprehensive metabolic panel  Result Value Ref Range   Sodium 139 135 - 145 mEq/L   Potassium 4.5 3.5 - 5.1 mEq/L   Chloride 103 96 - 112 mEq/L   CO2 28 19 - 32 mEq/L   Glucose, Bld 97 70 - 99 mg/dL   BUN 18 6 - 23 mg/dL   Creatinine, Ser 1.11 0.40 - 1.50 mg/dL   Total Bilirubin 0.9 0.2 - 1.2 mg/dL   Alkaline Phosphatase 55 39 - 117 U/L   AST 17 0 - 37 U/L   ALT 13 0 - 53 U/L   Total Protein 6.7 6.0 - 8.3 g/dL   Albumin 4.4 3.5 - 5.2 g/dL   GFR 72.17 >60.00 mL/min   Calcium 9.6 8.4 - 10.5 mg/dL  Hemoglobin A1c  Result Value Ref Range   Hgb A1c MFr Bld 5.2 4.6 - 6.5 %  Lipid panel  Result Value Ref Range   Cholesterol 141 0 - 200 mg/dL   Triglycerides 68.0 0 - 149 mg/dL   HDL 45.00 >39.00 mg/dL   VLDL 13.6 0.0 - 40.0 mg/dL   LDL Cholesterol 83 0 - 99 mg/dL   Total CHOL/HDL Ratio 3    NonHDL 96.48

## 2020-06-05 ENCOUNTER — Ambulatory Visit (INDEPENDENT_AMBULATORY_CARE_PROVIDER_SITE_OTHER): Payer: 59 | Admitting: Family Medicine

## 2020-06-05 ENCOUNTER — Other Ambulatory Visit: Payer: Self-pay

## 2020-06-05 ENCOUNTER — Encounter: Payer: Self-pay | Admitting: Family Medicine

## 2020-06-05 VITALS — BP 110/68 | HR 72 | Temp 97.9°F | Resp 16 | Ht 74.0 in | Wt 186.0 lb

## 2020-06-05 DIAGNOSIS — Z131 Encounter for screening for diabetes mellitus: Secondary | ICD-10-CM

## 2020-06-05 DIAGNOSIS — Z130 Encounter for screening for diseases of the blood and blood-forming organs and certain disorders involving the immune mechanism: Secondary | ICD-10-CM | POA: Diagnosis not present

## 2020-06-05 DIAGNOSIS — Z0000 Encounter for general adult medical examination without abnormal findings: Secondary | ICD-10-CM | POA: Diagnosis not present

## 2020-06-05 DIAGNOSIS — Z13220 Encounter for screening for lipoid disorders: Secondary | ICD-10-CM

## 2020-06-05 DIAGNOSIS — S99921A Unspecified injury of right foot, initial encounter: Secondary | ICD-10-CM

## 2020-06-05 DIAGNOSIS — Z1159 Encounter for screening for other viral diseases: Secondary | ICD-10-CM

## 2020-06-05 LAB — LIPID PANEL
Cholesterol: 141 mg/dL (ref 0–200)
HDL: 45 mg/dL (ref >39.00)
LDL Cholesterol: 83 mg/dL (ref 0–99)
NonHDL: 96.48
Total CHOL/HDL Ratio: 3
Triglycerides: 68 mg/dL (ref 0.0–149.0)
VLDL: 13.6 mg/dL (ref 0.0–40.0)

## 2020-06-05 LAB — COMPREHENSIVE METABOLIC PANEL
ALT: 13 U/L (ref 0–53)
AST: 17 U/L (ref 0–37)
Albumin: 4.4 g/dL (ref 3.5–5.2)
Alkaline Phosphatase: 55 U/L (ref 39–117)
BUN: 18 mg/dL (ref 6–23)
CO2: 28 mEq/L (ref 19–32)
Calcium: 9.6 mg/dL (ref 8.4–10.5)
Chloride: 103 mEq/L (ref 96–112)
Creatinine, Ser: 1.11 mg/dL (ref 0.40–1.50)
GFR: 72.17 mL/min (ref >60.00)
Glucose, Bld: 97 mg/dL (ref 70–99)
Potassium: 4.5 mEq/L (ref 3.5–5.1)
Sodium: 139 mEq/L (ref 135–145)
Total Bilirubin: 0.9 mg/dL (ref 0.2–1.2)
Total Protein: 6.7 g/dL (ref 6.0–8.3)

## 2020-06-05 LAB — CBC
HCT: 42.4 % (ref 39.0–52.0)
Hemoglobin: 14.7 g/dL (ref 13.0–17.0)
MCHC: 34.8 g/dL (ref 30.0–36.0)
MCV: 94.4 fl (ref 78.0–100.0)
Platelets: 182 10*3/uL (ref 150.0–400.0)
RBC: 4.49 Mil/uL (ref 4.22–5.81)
RDW: 12 % (ref 11.5–15.5)
WBC: 5.2 10*3/uL (ref 4.0–10.5)

## 2020-06-05 LAB — HEMOGLOBIN A1C: Hgb A1c MFr Bld: 5.2 % (ref 4.6–6.5)

## 2020-06-06 LAB — HEPATITIS C ANTIBODY
Hepatitis C Ab: NONREACTIVE
SIGNAL TO CUT-OFF: 0.01 (ref <1.00)

## 2020-06-10 ENCOUNTER — Telehealth: Payer: Self-pay

## 2020-06-10 ENCOUNTER — Encounter: Payer: Self-pay | Admitting: Family Medicine

## 2020-06-10 ENCOUNTER — Ambulatory Visit
Admission: RE | Admit: 2020-06-10 | Discharge: 2020-06-10 | Disposition: A | Discharge status: Home / Self Care | Payer: 59 | Source: Ambulatory Visit | Attending: Family Medicine | Admitting: Family Medicine

## 2020-06-10 ENCOUNTER — Other Ambulatory Visit: Payer: Self-pay

## 2020-06-10 DIAGNOSIS — S99921A Unspecified injury of right foot, initial encounter: Secondary | ICD-10-CM

## 2020-06-10 NOTE — Telephone Encounter (Signed)
Pt dropped off Physicians Result Form from employer for completion by provider. Once completed, please call pt and he will come pick up the form [phone]).  Form placed in Dr. Lillie Fragmin box for pick up and completion.

## 2021-05-09 ENCOUNTER — Encounter: Payer: 59 | Admitting: Family Medicine

## 2021-05-16 NOTE — Patient Instructions (Addendum)
Good to see you again today! I will be in touch with your labs You got a tetanus booster today Have a great time on your vacation! You can stop by imaging on the ground floor to schedule ultrasound of the 2 likely lipomas on your body.  They may be able to do this for you today

## 2021-05-16 NOTE — Progress Notes (Addendum)
[facility] at [facility] [place], [place], [place] [address] [phone]  Date:  05/23/2021   Name:  Leon Simmons   DOB:  02-18-1976   MRN:  [id number]  PCP:  Darreld Mclean, MD    Chief Complaint: Annual Exam (CPE) and unknow mass on body (One on sterum for years and now one has appeared on left inner arm (by elbow))   History of Present Illness:  Leon Simmons is a 45 y.o. very pleasant male patient who presents with the following:  Pt seen today for a CPE Last visit with myself about one year ago  Generally in good health   Married to [place] rising 6th and 7th grades He enjoys exercise and golf - he is trying to avoid the sun as he recently had precancerous skin lesions removed, getting more exercise at home.  He will work out 3-4 x a week with weights and is also doing some body weight exercises   Covid booster-not done yet, you did have the 2 shot series.  I encouraged him to get a booster at his convenience Labs one year ago - he is fasting today  Due for tetanus this fall -he would like to go ahead and boost today  He notes a firm, mobile mass on his left medial elbow- he thinks it has been there less than a year Not painful He has a similar but larger mass over his sternum as well- this has been present for a couple of years at least    Patient Active Problem List   Diagnosis Date Noted   Visit for preventive health examination 03/16/2016    Past Medical History:  Diagnosis Date   Actinic keratosis    Broken leg    Phlebitis     Past Surgical History:  Procedure Laterality Date   HAND SURGERY      Social History   Tobacco Use   Smoking status: Never   Smokeless tobacco: Never  Substance Use Topics   Alcohol use: Yes    Alcohol/week: 0.0 standard drinks    Comment: rare   Drug use: No    Family History  Problem Relation Age of Onset   Heart disease Maternal Grandfather 60        Complications after a MI    Cancer Paternal Grandfather 21       Prostate    Arthritis Maternal Grandmother    Healthy Child        x 2    Heart disease Father    Cancer Father        Colon Polyps   Cancer Paternal Uncle        Prostate 60    No Known Allergies  Medication list has been reviewed and updated.  Current Outpatient Medications on File Prior to Visit  Medication Sig Dispense Refill   Multiple Vitamin (MULTIVITAMIN) tablet Take 1 tablet by mouth daily.     No current facility-administered medications on file prior to visit.    Review of Systems:  As per HPI- otherwise negative.   Physical Examination: Vitals:   05/23/21 0939  BP: 136/74  Pulse: 75  Temp: 97.9 F (36.6 C)  SpO2: 98%   Vitals:   05/23/21 0939  Weight: 189 lb 3.2 oz (85.8 kg)  Height: '6\' 2"'$  (1.88 m)   Body mass index is 24.29 kg/m. Ideal Body Weight: Weight in (lb) to have BMI =  25: 194.3  GEN: no acute distress.  Looks well, normal weight HEENT: Atraumatic, Normocephalic.  Bilateral TM wnl, oropharynx normal.  PEERL,EOMI.   Ears and Nose: No external deformity. CV: RRR, No M/G/R. No JVD. No thrill. No extra heart sounds. PULM: CTA B, no wheezes, crackles, rhonchi. No retractions. No resp. distress. No accessory muscle use. ABD: S, NT, ND, +BS. No rebound. No HSM. EXTR: No c/c/e PSYCH: Normally interactive. Conversant.  There is a soft, mobile mass over the inferior sternum, left aspect.  Size of a half ping-pong ball. He has a smaller, similar rubbery mass on the medial aspect of the left elbow.  It is the size of a blueberry  Assessment and Plan: Physical exam  Screening for diabetes mellitus - Plan: Comprehensive metabolic panel, Hemoglobin A1c  Screening for hyperlipidemia - Plan: Lipid panel  Screening for deficiency anemia - Plan: CBC  Immunization due - Plan: Td vaccine greater than or equal to 7yo preservative free IM  Lipoma of left upper extremity - Plan: [place] LT  UPPER EXTREM LTD SOFT TISSUE NON VASCULAR  Lipoma of anterior chest wall - Plan: [place] CHEST SOFT TISSUE  Physical exam Encouraged healthy diet and exercise routine Will plan further follow- up pending labs. Updated tetanus Will ultrasound masses as described above to confirm benign lipomas  This visit occurred during the SARS-CoV-2 public health emergency.  Safety protocols were in place, including screening questions prior to the visit, additional usage of staff PPE, and extensive cleaning of exam room while observing appropriate contact time as indicated for disinfecting solutions.   Signed Lamar Blinks, MD  Received labs as below, message to pt Results for orders placed or performed in visit on 05/23/21  CBC  Result Value Ref Range   WBC 5.2 4.0 - 10.5 K/uL   RBC 4.85 4.22 - 5.81 Mil/uL   Platelets 213.0 150.0 - 400.0 K/uL   Hemoglobin 15.8 13.0 - 17.0 g/dL   HCT 45.0 39.0 - 52.0 %   MCV 92.9 78.0 - 100.0 fl   MCHC 35.0 30.0 - 36.0 g/dL   RDW 12.6 11.5 - 15.5 %  Comprehensive metabolic panel  Result Value Ref Range   Sodium 140 135 - 145 mEq/L   Potassium 4.5 3.5 - 5.1 mEq/L   Chloride 103 96 - 112 mEq/L   CO2 31 19 - 32 mEq/L   Glucose, Bld 94 70 - 99 mg/dL   BUN 14 6 - 23 mg/dL   Creatinine, Ser 1.03 0.40 - 1.50 mg/dL   Total Bilirubin 1.1 0.2 - 1.2 mg/dL   Alkaline Phosphatase 62 39 - 117 U/L   AST 19 0 - 37 U/L   ALT 15 0 - 53 U/L   Total Protein 7.2 6.0 - 8.3 g/dL   Albumin 4.6 3.5 - 5.2 g/dL   GFR 88.45 >60.00 mL/min   Calcium 9.8 8.4 - 10.5 mg/dL  Hemoglobin A1c  Result Value Ref Range   Hgb A1c MFr Bld 5.4 4.6 - 6.5 %  Lipid panel  Result Value Ref Range   Cholesterol 161 0 - 200 mg/dL   Triglycerides 89.0 0.0 - 149.0 mg/dL   HDL 43.10 >39.00 mg/dL   VLDL 17.8 0.0 - 40.0 mg/dL   LDL Cholesterol 100 (H) 0 - 99 mg/dL   Total CHOL/HDL Ratio 4    NonHDL 118.07

## 2021-05-23 ENCOUNTER — Ambulatory Visit (INDEPENDENT_AMBULATORY_CARE_PROVIDER_SITE_OTHER): Payer: 59 | Admitting: Family Medicine

## 2021-05-23 ENCOUNTER — Encounter: Payer: Self-pay | Admitting: Family Medicine

## 2021-05-23 ENCOUNTER — Other Ambulatory Visit: Payer: Self-pay

## 2021-05-23 VITALS — BP 136/74 | HR 75 | Temp 97.9°F | Ht 74.0 in | Wt 189.2 lb

## 2021-05-23 DIAGNOSIS — Z0000 Encounter for general adult medical examination without abnormal findings: Secondary | ICD-10-CM

## 2021-05-23 DIAGNOSIS — D171 Benign lipomatous neoplasm of skin and subcutaneous tissue of trunk: Secondary | ICD-10-CM

## 2021-05-23 DIAGNOSIS — Z131 Encounter for screening for diabetes mellitus: Secondary | ICD-10-CM | POA: Diagnosis not present

## 2021-05-23 DIAGNOSIS — Z130 Encounter for screening for diseases of the blood and blood-forming organs and certain disorders involving the immune mechanism: Secondary | ICD-10-CM

## 2021-05-23 DIAGNOSIS — Z13220 Encounter for screening for lipoid disorders: Secondary | ICD-10-CM

## 2021-05-23 DIAGNOSIS — Z23 Encounter for immunization: Secondary | ICD-10-CM | POA: Diagnosis not present

## 2021-05-23 DIAGNOSIS — D1722 Benign lipomatous neoplasm of skin and subcutaneous tissue of left arm: Secondary | ICD-10-CM

## 2021-05-23 LAB — CBC
HCT: 45 % (ref 39.0–52.0)
Hemoglobin: 15.8 g/dL (ref 13.0–17.0)
MCHC: 35 g/dL (ref 30.0–36.0)
MCV: 92.9 fl (ref 78.0–100.0)
Platelets: 213 10*3/uL (ref 150.0–400.0)
RBC: 4.85 Mil/uL (ref 4.22–5.81)
RDW: 12.6 % (ref 11.5–15.5)
WBC: 5.2 10*3/uL (ref 4.0–10.5)

## 2021-05-23 LAB — COMPREHENSIVE METABOLIC PANEL
ALT: 15 U/L (ref 0–53)
AST: 19 U/L (ref 0–37)
Albumin: 4.6 g/dL (ref 3.5–5.2)
Alkaline Phosphatase: 62 U/L (ref 39–117)
BUN: 14 mg/dL (ref 6–23)
CO2: 31 mEq/L (ref 19–32)
Calcium: 9.8 mg/dL (ref 8.4–10.5)
Chloride: 103 mEq/L (ref 96–112)
Creatinine, Ser: 1.03 mg/dL (ref 0.40–1.50)
GFR: 88.45 mL/min (ref >60.00)
Glucose, Bld: 94 mg/dL (ref 70–99)
Potassium: 4.5 mEq/L (ref 3.5–5.1)
Sodium: 140 mEq/L (ref 135–145)
Total Bilirubin: 1.1 mg/dL (ref 0.2–1.2)
Total Protein: 7.2 g/dL (ref 6.0–8.3)

## 2021-05-23 LAB — LIPID PANEL
Cholesterol: 161 mg/dL (ref 0–200)
HDL: 43.1 mg/dL (ref >39.00)
LDL Cholesterol: 100 mg/dL — ABNORMAL HIGH (ref 0–99)
NonHDL: 118.07
Total CHOL/HDL Ratio: 4
Triglycerides: 89 mg/dL (ref 0.0–149.0)
VLDL: 17.8 mg/dL (ref 0.0–40.0)

## 2021-05-23 LAB — HEMOGLOBIN A1C: Hgb A1c MFr Bld: 5.4 % (ref 4.6–6.5)

## 2021-06-02 ENCOUNTER — Ambulatory Visit (HOSPITAL_BASED_OUTPATIENT_CLINIC_OR_DEPARTMENT_OTHER)
Admission: RE | Admit: 2021-06-02 | Discharge: 2021-06-02 | Disposition: A | Discharge status: Home / Self Care | Payer: 59 | Source: Ambulatory Visit | Attending: Family Medicine | Admitting: Family Medicine

## 2021-06-02 ENCOUNTER — Other Ambulatory Visit: Payer: Self-pay

## 2021-06-02 DIAGNOSIS — D171 Benign lipomatous neoplasm of skin and subcutaneous tissue of trunk: Secondary | ICD-10-CM | POA: Insufficient documentation

## 2021-06-02 DIAGNOSIS — D1722 Benign lipomatous neoplasm of skin and subcutaneous tissue of left arm: Secondary | ICD-10-CM | POA: Insufficient documentation

## 2021-06-03 ENCOUNTER — Encounter: Payer: Self-pay | Admitting: Family Medicine

## 2022-01-25 IMAGING — US US EXTREM UP*L* LTD
1 series · 10 of 10 positions shown · non-contrast
Comparison: None.

CLINICAL DATA: Lipoma of left upper extremity

EXAM:
ULTRASOUND left UPPER EXTREMITY LIMITED
TECHNIQUE: Ultrasound examination of the upper extremity soft tissues was
performed in the area of clinical concern.

[Series 1: us extrem up*left* ltd · 10 acquisitions, 10 frames shown]
[im 1/10]
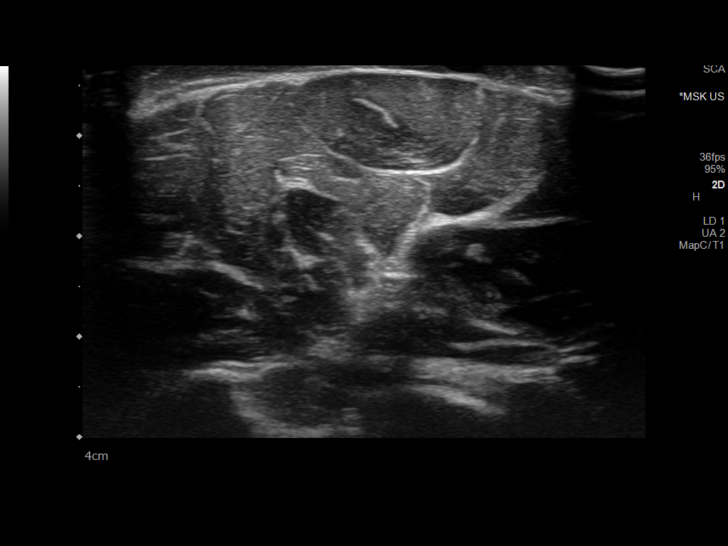
[im 2/10]
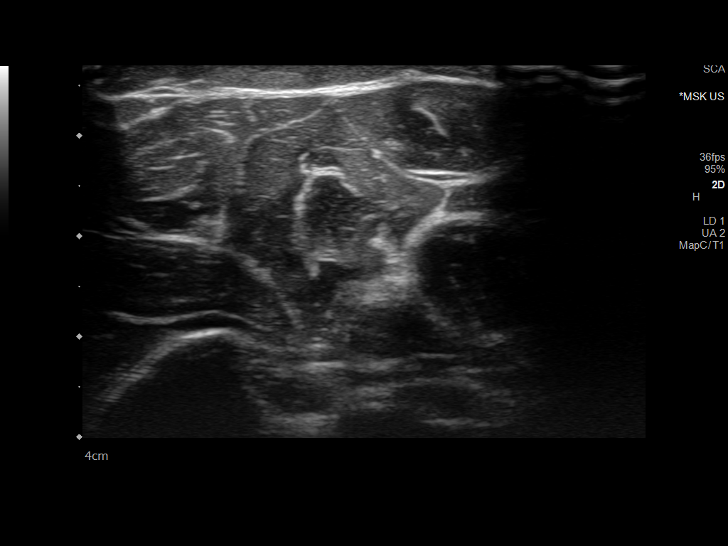
[im 3/10]
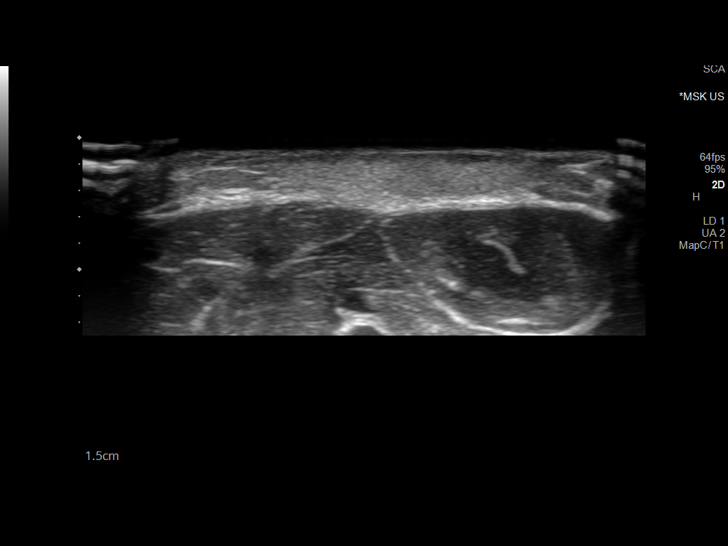
[im 4/10]
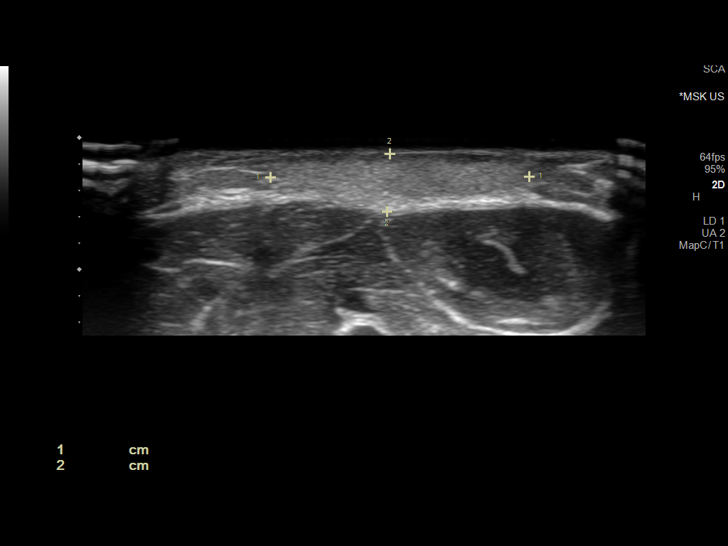
[im 5/10]
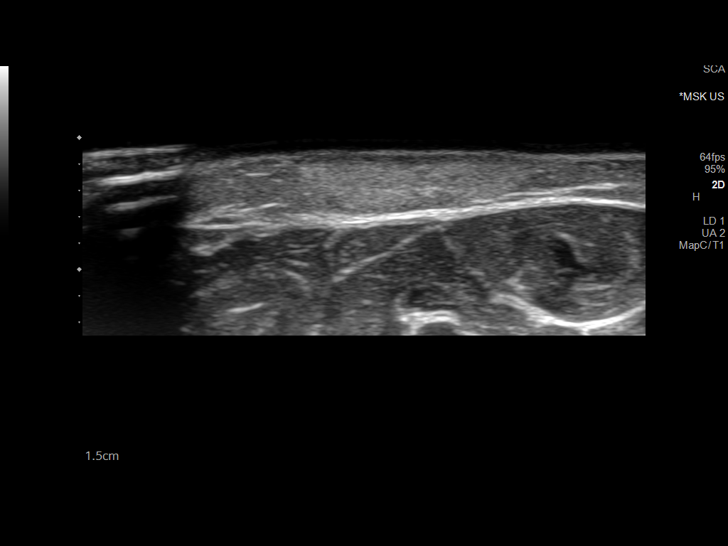
[im 6/10]
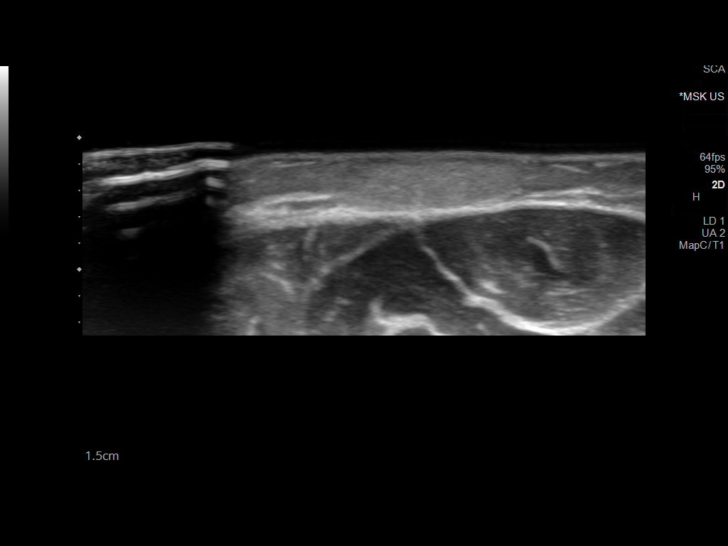
[im 7/10]
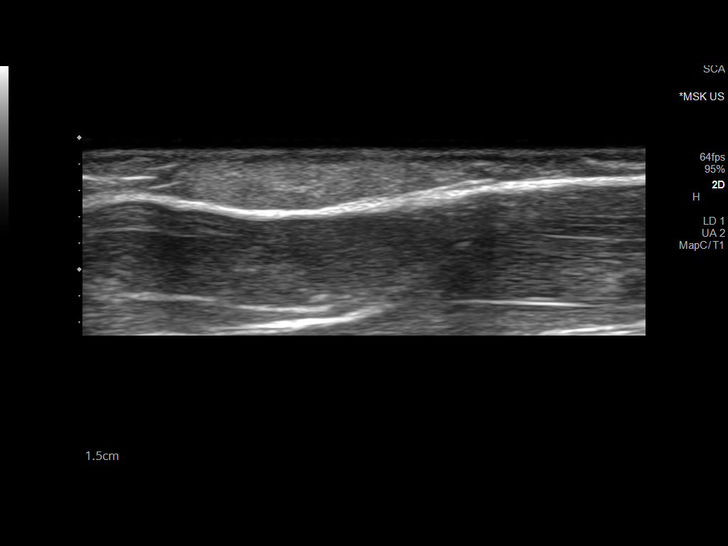
[im 8/10]
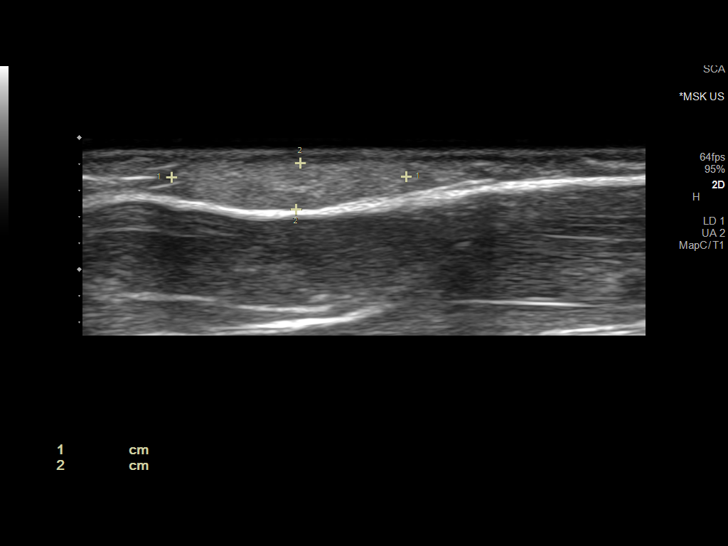
[im 9/10]
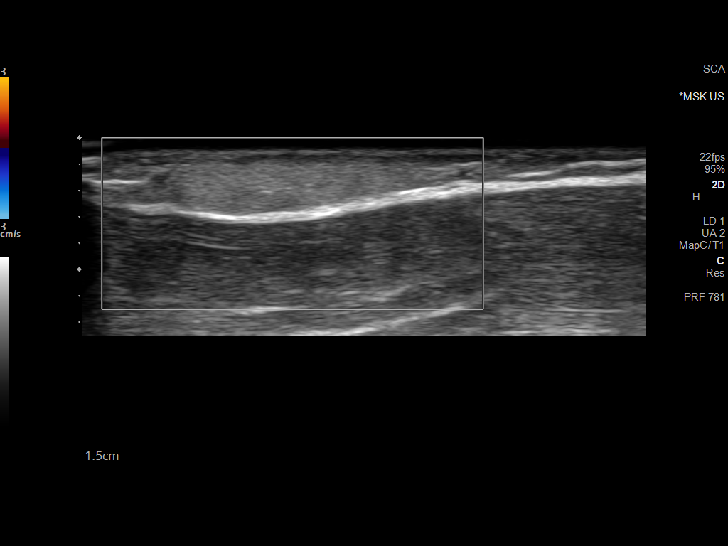
[im 10/10]
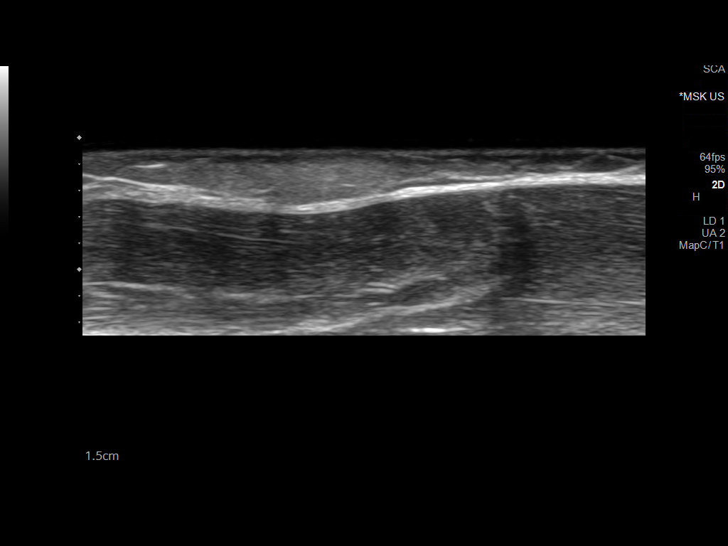

[10 of 10 positions shown; findings below may reference images not displayed]

FINDINGS: Within the palpable area of concern, there is a subcutaneous
hyperechoic mass measuring 1.8 x 0.4 x 0.4 cm. There is no internal
vascularity. There is mild mass effect on the underlying
musculature.
IMPRESSION: Subcutaneous hyperechoic mass measuring 1.8 x 0.4 x 0.4 cm
compatible with a lipoma. This corresponds with the palpable area of
concern along the left elbow.

## 2022-05-31 NOTE — Patient Instructions (Addendum)
Good to see you today- I will be in touch with your labs asap  Continue to see dermatology on a regular basis Cologaurd kit will come to your home- please complete asap!   Keep up the good work with diet and exercise

## 2022-05-31 NOTE — Progress Notes (Addendum)
[place] at [facility] [place], [place], [place] [address] [phone]  Date:  06/04/2022   Name:  Leon Simmons   DOB:  09/14/1976   MRN:  [id number]  PCP:  Darreld Mclean, MD    Chief Complaint: Annual Exam (Concerns/ questions: none)   History of Present Illness:  Leon Simmons is a 46 y.o. very pleasant male patient who presents with the following:  Here today for a CPE Generally in good health Last visit with myself about one year ago   Married to [place] rising 7th and 8th- they are doing well, they attend school in [place] Enjoys exercise and also golf- he is enjoying golf and working out at home  He will run some with his kids and swim  Colon cancer screening- no colon cancer history in the family.  Discussed options and he would like to do Cologaurd Labs done one year ago - update today  Any family history of prostate cancer- his PGF He would like to start PSA screening   His father has an abnormal liver function of some sort  He did have a fall recently- tripped while trail running. Abrasion only  No CP or SOB with exercise   Patient Active Problem List   Diagnosis Date Noted   Visit for preventive health examination 03/16/2016    Past Medical History:  Diagnosis Date   Actinic keratosis    Broken leg    Phlebitis     Past Surgical History:  Procedure Laterality Date   HAND SURGERY      Social History   Tobacco Use   Smoking status: Never   Smokeless tobacco: Never  Substance Use Topics   Alcohol use: Yes    Alcohol/week: 0.0 standard drinks of alcohol    Comment: rare   Drug use: No    Family History  Problem Relation Age of Onset   Heart disease Maternal Grandfather 60       Complications after a MI    Cancer Paternal Grandfather 96       Prostate    Arthritis Maternal Grandmother    Healthy Child        x 2    Heart disease Father    Cancer Father        Colon  Polyps   Cancer Paternal Uncle        Prostate 60    No Known Allergies  Medication list has been reviewed and updated.  Current Outpatient Medications on File Prior to Visit  Medication Sig Dispense Refill   Multiple Vitamin (MULTIVITAMIN) tablet Take 1 tablet by mouth daily.     No current facility-administered medications on file prior to visit.    Review of Systems:  As per HPI- otherwise negative.   Physical Examination: Vitals:   06/04/22 0900  BP: 110/74  Pulse: 61  Resp: 18  Temp: 97.6 F (36.4 C)  SpO2: 97%   Vitals:   06/04/22 0900  Weight: 184 lb 6.4 oz (83.6 kg)  Height: '6\' 2"'$  (1.88 m)   Body mass index is 23.68 kg/m. Ideal Body Weight: Weight in (lb) to have BMI = 25: 194.3  GEN: no acute distress.   Normal weight looks well  HEENT: Atraumatic, Normocephalic.  Bilateral TM wnl, oropharynx normal.  PEERL,EOMI.   Ears and Nose: No external deformity. CV: RRR, No M/G/R. No JVD. No thrill. No extra heart sounds. PULM: CTA B,  no wheezes, crackles, rhonchi. No retractions. No resp. distress. No accessory muscle use. ABD: S, NT, ND, +BS. No rebound. No HSM. EXTR: No c/c/e PSYCH: Normally interactive. Conversant.    Assessment and Plan: Physical exam  Screening for diabetes mellitus - Plan: Comprehensive metabolic panel, Hemoglobin A1c  Screening for hyperlipidemia - Plan: Lipid panel  Screening for deficiency anemia - Plan: CBC  Screening for prostate cancer - Plan: PSA  Screening for colon cancer - Plan: Cologuard  Physical exam today- encouraged healthy diet and exercise routine  Order cologuard  Will plan further follow- up pending labs.  Signed Lamar Blinks, MD  Received labs as below, letter to patient Results for orders placed or performed in visit on 06/04/22  CBC  Result Value Ref Range   WBC 4.8 4.0 - 10.5 K/uL   RBC 4.65 4.22 - 5.81 Mil/uL   Platelets 221.0 150.0 - 400.0 K/uL   Hemoglobin 15.3 13.0 - 17.0 g/dL   HCT 44.4  39.0 - 52.0 %   MCV 95.5 78.0 - 100.0 fl   MCHC 34.4 30.0 - 36.0 g/dL   RDW 12.1 11.5 - 15.5 %  Comprehensive metabolic panel  Result Value Ref Range   Sodium 140 135 - 145 mEq/L   Potassium 4.9 3.5 - 5.1 mEq/L   Chloride 104 96 - 112 mEq/L   CO2 31 19 - 32 mEq/L   Glucose, Bld 92 70 - 99 mg/dL   BUN 18 6 - 23 mg/dL   Creatinine, Ser 1.14 0.40 - 1.50 mg/dL   Total Bilirubin 1.0 0.2 - 1.2 mg/dL   Alkaline Phosphatase 58 39 - 117 U/L   AST 16 0 - 37 U/L   ALT 12 0 - 53 U/L   Total Protein 6.8 6.0 - 8.3 g/dL   Albumin 4.6 3.5 - 5.2 g/dL   GFR 77.74 >60.00 mL/min   Calcium 9.8 8.4 - 10.5 mg/dL  Hemoglobin A1c  Result Value Ref Range   Hgb A1c MFr Bld 5.4 4.6 - 6.5 %  Lipid panel  Result Value Ref Range   Cholesterol 144 0 - 200 mg/dL   Triglycerides 56.0 0.0 - 149.0 mg/dL   HDL 48.30 >39.00 mg/dL   VLDL 11.2 0.0 - 40.0 mg/dL   LDL Cholesterol 85 0 - 99 mg/dL   Total CHOL/HDL Ratio 3    NonHDL 95.71   PSA  Result Value Ref Range   PSA 0.55 0.10 - 4.00 ng/mL

## 2022-06-04 ENCOUNTER — Ambulatory Visit (INDEPENDENT_AMBULATORY_CARE_PROVIDER_SITE_OTHER): Payer: 59 | Admitting: Family Medicine

## 2022-06-04 ENCOUNTER — Encounter: Payer: Self-pay | Admitting: Family Medicine

## 2022-06-04 VITALS — BP 110/74 | HR 61 | Temp 97.6°F | Resp 18 | Ht 74.0 in | Wt 184.4 lb

## 2022-06-04 DIAGNOSIS — Z131 Encounter for screening for diabetes mellitus: Secondary | ICD-10-CM

## 2022-06-04 DIAGNOSIS — Z130 Encounter for screening for diseases of the blood and blood-forming organs and certain disorders involving the immune mechanism: Secondary | ICD-10-CM | POA: Diagnosis not present

## 2022-06-04 DIAGNOSIS — Z125 Encounter for screening for malignant neoplasm of prostate: Secondary | ICD-10-CM | POA: Diagnosis not present

## 2022-06-04 DIAGNOSIS — Z0000 Encounter for general adult medical examination without abnormal findings: Secondary | ICD-10-CM | POA: Diagnosis not present

## 2022-06-04 DIAGNOSIS — Z13220 Encounter for screening for lipoid disorders: Secondary | ICD-10-CM | POA: Diagnosis not present

## 2022-06-04 DIAGNOSIS — Z1211 Encounter for screening for malignant neoplasm of colon: Secondary | ICD-10-CM

## 2022-06-04 LAB — COMPREHENSIVE METABOLIC PANEL
ALT: 12 U/L (ref 0–53)
AST: 16 U/L (ref 0–37)
Albumin: 4.6 g/dL (ref 3.5–5.2)
Alkaline Phosphatase: 58 U/L (ref 39–117)
BUN: 18 mg/dL (ref 6–23)
CO2: 31 mEq/L (ref 19–32)
Calcium: 9.8 mg/dL (ref 8.4–10.5)
Chloride: 104 mEq/L (ref 96–112)
Creatinine, Ser: 1.14 mg/dL (ref 0.40–1.50)
GFR: 77.74 mL/min (ref >60.00)
Glucose, Bld: 92 mg/dL (ref 70–99)
Potassium: 4.9 mEq/L (ref 3.5–5.1)
Sodium: 140 mEq/L (ref 135–145)
Total Bilirubin: 1 mg/dL (ref 0.2–1.2)
Total Protein: 6.8 g/dL (ref 6.0–8.3)

## 2022-06-04 LAB — CBC
HCT: 44.4 % (ref 39.0–52.0)
Hemoglobin: 15.3 g/dL (ref 13.0–17.0)
MCHC: 34.4 g/dL (ref 30.0–36.0)
MCV: 95.5 fl (ref 78.0–100.0)
Platelets: 221 10*3/uL (ref 150.0–400.0)
RBC: 4.65 Mil/uL (ref 4.22–5.81)
RDW: 12.1 % (ref 11.5–15.5)
WBC: 4.8 10*3/uL (ref 4.0–10.5)

## 2022-06-04 LAB — LIPID PANEL
Cholesterol: 144 mg/dL (ref 0–200)
HDL: 48.3 mg/dL (ref >39.00)
LDL Cholesterol: 85 mg/dL (ref 0–99)
NonHDL: 95.71
Total CHOL/HDL Ratio: 3
Triglycerides: 56 mg/dL (ref 0.0–149.0)
VLDL: 11.2 mg/dL (ref 0.0–40.0)

## 2022-06-04 LAB — PSA: PSA: 0.55 ng/mL (ref 0.10–4.00)

## 2022-06-04 LAB — HEMOGLOBIN A1C: Hgb A1c MFr Bld: 5.4 % (ref 4.6–6.5)

## 2022-06-04 NOTE — Telephone Encounter (Signed)
Forms are in folder for completion.

## 2022-06-23 ENCOUNTER — Encounter: Payer: Self-pay | Admitting: Family Medicine

## 2022-06-23 LAB — COLOGUARD: COLOGUARD: NEGATIVE

## 2023-05-26 ENCOUNTER — Encounter (INDEPENDENT_AMBULATORY_CARE_PROVIDER_SITE_OTHER): Payer: Self-pay

## 2023-06-11 NOTE — Patient Instructions (Signed)
 It was great to see you again today, I will be in touch with your lab work  Recommend COVID booster, flu shot this fall

## 2023-06-11 NOTE — Progress Notes (Addendum)
[facility] [address], [address] [place], [place] [address] [phone]  Date:  06/16/2023   Name:  Leon Simmons   DOB:  [birth year]   MRN:  [id number]  PCP:  Pearline Cables, MD    Chief Complaint: Annual Exam (Concerns/ questions: /)   History of Present Illness:  Leon Simmons is a 47 y.o. very pleasant male patient who presents with the following:  Patient seen today for physical exam With generally good health, most recent visit with myself about 1 year ago  Married to [place], his children are rising eighth and ninth grade - they are both doing well He enjoys golf and exercise  Colon cancer screening-negative Cologuard last year Can update blood work today Never smoker, rare alcohol  He did injure his left achilles last fall- it is still a bit still in the am and can be sore with some activities but has gotten better, he feels like this is doing ok  No CP or SOB with exercise  Patient Active Problem List   Diagnosis Date Noted   Visit for preventive health examination 03/16/2016    Past Medical History:  Diagnosis Date   Actinic keratosis    Broken leg    Phlebitis     Past Surgical History:  Procedure Laterality Date   HAND SURGERY      Social History   Tobacco Use   Smoking status: Never   Smokeless tobacco: Never  Substance Use Topics   Alcohol use: Yes    Alcohol/week: 0.0 standard drinks of alcohol    Comment: rare   Drug use: No    Family History  Problem Relation Age of Onset   Heart disease Maternal Grandfather 60       Complications after a MI    Cancer Paternal Grandfather 94       Prostate    Arthritis Maternal Grandmother    Healthy Child        x 2    Heart disease Father    Cancer Father        Colon Polyps   Cancer Paternal Uncle        Prostate 60    No Known Allergies  Medication list has been reviewed and updated.  Current Outpatient Medications on File Prior to  Visit  Medication Sig Dispense Refill   Multiple Vitamin (MULTIVITAMIN) tablet Take 1 tablet by mouth daily.     No current facility-administered medications on file prior to visit.    Review of Systems:  As per HPI- otherwise negative.   Physical Examination: Vitals:   06/16/23 0819  BP: 112/60  Pulse: 73  Resp: 18  Temp: 98.4 F (36.9 C)  SpO2: 98%   Vitals:   06/16/23 0819  Weight: 189 lb 9.6 oz (86 kg)  Height: 6\' 2"  (1.88 m)   Body mass index is 24.34 kg/m. Ideal Body Weight: Weight in (lb) to have BMI = 25: 194.3  GEN: no acute distress. Normal weight, looks well  HEENT: Atraumatic, Normocephalic.  Bilateral TM wnl, oropharynx normal.  PEERL,EOMI.   Ears and Nose: No external deformity. CV: RRR, No M/G/R. No JVD. No thrill. No extra heart sounds. PULM: CTA B, no wheezes, crackles, rhonchi. No retractions. No resp. distress. No accessory muscle use. ABD: S, NT, ND, +BS. No rebound. No HSM. EXTR: No c/c/e PSYCH: Normally interactive. Conversant.  Left achilles is normal to palpation   Assessment  and Plan: Physical exam  Screening for diabetes mellitus - Plan: Comprehensive metabolic panel, Hemoglobin A1c  Screening for hyperlipidemia - Plan: Lipid panel  Screening for prostate cancer - Plan: PSA  Screening for deficiency anemia - Plan: CBC  Physical exam today, encouraged healthy diet and exercise routine Will plan further follow- up pending labs. Offered to have him see sports med or ortho for achilles injury last year- for now he declines, he is managing this ok with activity modification   Signed Abbe Amsterdam, MD  Addendum 8/23, received labs as below.  Message to patient  Lab Results  Component Value Date   PSA 0.84 06/16/2023   PSA 0.55 06/04/2022      Results for orders placed or performed in visit on 06/16/23  CBC  Result Value Ref Range   WBC 5.4 4.0 - 10.5 K/uL   RBC 4.88 4.22 - 5.81 Mil/uL   Platelets 237.0 150.0 - 400.0 K/uL    Hemoglobin 15.9 13.0 - 17.0 g/dL   HCT 16.1 09.6 - 04.5 %   MCV 95.0 78.0 - 100.0 fl   MCHC 34.3 30.0 - 36.0 g/dL   RDW 40.9 81.1 - 91.4 %  Comprehensive metabolic panel  Result Value Ref Range   Sodium 140 135 - 145 mEq/L   Potassium 4.6 3.5 - 5.1 mEq/L   Chloride 103 96 - 112 mEq/L   CO2 30 19 - 32 mEq/L   Glucose, Bld 99 70 - 99 mg/dL   BUN 16 6 - 23 mg/dL   Creatinine, Ser 7.82 0.40 - 1.50 mg/dL   Total Bilirubin 0.9 0.2 - 1.2 mg/dL   Alkaline Phosphatase 65 39 - 117 U/L   AST 16 0 - 37 U/L   ALT 13 0 - 53 U/L   Total Protein 7.4 6.0 - 8.3 g/dL   Albumin 4.7 3.5 - 5.2 g/dL   GFR 95.62 >13.08 mL/min   Calcium 10.0 8.4 - 10.5 mg/dL  Hemoglobin M5H  Result Value Ref Range   Hgb A1c MFr Bld 5.4 4.6 - 6.5 %  Lipid panel  Result Value Ref Range   Cholesterol 181 0 - 200 mg/dL   Triglycerides 846.9 0.0 - 149.0 mg/dL   HDL 62.95 >28.41 mg/dL   VLDL 32.4 0.0 - 40.1 mg/dL   LDL Cholesterol 027 (H) 0 - 99 mg/dL   Total CHOL/HDL Ratio 4    NonHDL 137.50   PSA  Result Value Ref Range   PSA 0.84 0.10 - 4.00 ng/mL

## 2023-06-16 ENCOUNTER — Ambulatory Visit (INDEPENDENT_AMBULATORY_CARE_PROVIDER_SITE_OTHER): Payer: 59 | Admitting: Family Medicine

## 2023-06-16 VITALS — BP 112/60 | HR 73 | Temp 98.4°F | Resp 18 | Ht 74.0 in | Wt 189.6 lb

## 2023-06-16 DIAGNOSIS — Z125 Encounter for screening for malignant neoplasm of prostate: Secondary | ICD-10-CM

## 2023-06-16 DIAGNOSIS — Z131 Encounter for screening for diabetes mellitus: Secondary | ICD-10-CM

## 2023-06-16 DIAGNOSIS — Z130 Encounter for screening for diseases of the blood and blood-forming organs and certain disorders involving the immune mechanism: Secondary | ICD-10-CM | POA: Diagnosis not present

## 2023-06-16 DIAGNOSIS — Z0000 Encounter for general adult medical examination without abnormal findings: Secondary | ICD-10-CM | POA: Diagnosis not present

## 2023-06-16 DIAGNOSIS — Z13220 Encounter for screening for lipoid disorders: Secondary | ICD-10-CM | POA: Diagnosis not present

## 2023-06-16 LAB — COMPREHENSIVE METABOLIC PANEL
ALT: 13 U/L (ref 0–53)
AST: 16 U/L (ref 0–37)
Albumin: 4.7 g/dL (ref 3.5–5.2)
Alkaline Phosphatase: 65 U/L (ref 39–117)
BUN: 16 mg/dL (ref 6–23)
CO2: 30 mEq/L (ref 19–32)
Calcium: 10 mg/dL (ref 8.4–10.5)
Chloride: 103 meq/L (ref 96–112)
Creatinine, Ser: 1.08 mg/dL (ref 0.40–1.50)
GFR: 82.35 mL/min (ref >60.00)
Glucose, Bld: 99 mg/dL (ref 70–99)
Potassium: 4.6 meq/L (ref 3.5–5.1)
Sodium: 140 meq/L (ref 135–145)
Total Bilirubin: 0.9 mg/dL (ref 0.2–1.2)
Total Protein: 7.4 g/dL (ref 6.0–8.3)

## 2023-06-16 LAB — LIPID PANEL
Cholesterol: 181 mg/dL (ref 0–200)
HDL: 43.1 mg/dL (ref >39.00)
LDL Cholesterol: 115 mg/dL — ABNORMAL HIGH (ref 0–99)
NonHDL: 137.5
Total CHOL/HDL Ratio: 4
Triglycerides: 115 mg/dL (ref 0.0–149.0)
VLDL: 23 mg/dL (ref 0.0–40.0)

## 2023-06-16 LAB — CBC
HCT: 46.4 % (ref 39.0–52.0)
Hemoglobin: 15.9 g/dL (ref 13.0–17.0)
MCHC: 34.3 g/dL (ref 30.0–36.0)
MCV: 95 fl (ref 78.0–100.0)
Platelets: 237 10*3/uL (ref 150.0–400.0)
RBC: 4.88 Mil/uL (ref 4.22–5.81)
RDW: 12.5 % (ref 11.5–15.5)
WBC: 5.4 10*3/uL (ref 4.0–10.5)

## 2023-06-16 LAB — HEMOGLOBIN A1C: Hgb A1c MFr Bld: 5.4 % (ref 4.6–6.5)

## 2023-06-18 ENCOUNTER — Encounter: Payer: Self-pay | Admitting: Family Medicine

## 2023-06-18 LAB — PSA: PSA: 0.84 ng/mL (ref 0.10–4.00)

## 2023-07-01 ENCOUNTER — Telehealth: Payer: Self-pay

## 2023-07-01 NOTE — Telephone Encounter (Signed)
Pt provided email address: [email or web address]

## 2023-07-01 NOTE — Telephone Encounter (Signed)
Form has been emailed.

## 2023-07-01 NOTE — Telephone Encounter (Signed)
Called pt to see if he could provide [place] with an email address so we can send him his "Physician Results Form." Left vm to return call.

## 2024-06-16 NOTE — Patient Instructions (Addendum)
 Good to see you today I will be in touch with your labs  Recommend flu shot this fall I can complete your form when ready!

## 2024-06-16 NOTE — Progress Notes (Signed)
 [facility] [address], [address] [place], [place] [address] [phone]  Date:  06/19/2024   Name:  Leon Simmons   DOB:  02/14/1976   MRN:  [id number]  PCP:  Watt Harlene BROCKS, MD    Chief Complaint: Annual Exam   History of Present Illness:  Leon Simmons is a 48 y.o. very pleasant male patient who presents with the following:  Pt seen today for CPE- I last saw him about one year ago Married to [place]- children 9th and 10th grades this year   Colon cancer screening- cologuard 2023 Labs- update today  Never a smoker, rare etoh  He notes his father has a liver issue- he has lobar atrophy  He continues to get plenty of exercise   34 in waist  No CP or SOB with exercise   Patient Active Problem List   Diagnosis Date Noted   Visit for preventive health examination 03/16/2016    Past Medical History:  Diagnosis Date   Actinic keratosis    Broken leg    Phlebitis     Past Surgical History:  Procedure Laterality Date   HAND SURGERY      Social History   Tobacco Use   Smoking status: Never   Smokeless tobacco: Never  Substance Use Topics   Alcohol use: Yes    Alcohol/week: 0.0 standard drinks of alcohol    Comment: rare   Drug use: No    Family History  Problem Relation Age of Onset   Heart disease Maternal Grandfather 60       Complications after a MI    Cancer Paternal Grandfather 8       Prostate    Arthritis Maternal Grandmother    Healthy Child        x 2    Heart disease Father    Cancer Father        Colon Polyps   Cancer Paternal Uncle        Prostate 60    No Known Allergies  Medication list has been reviewed and updated.  Current Outpatient Medications on File Prior to Visit  Medication Sig Dispense Refill   Multiple Vitamin (MULTIVITAMIN) tablet Take 1 tablet by mouth daily.     No current facility-administered medications on file prior to visit.    Review of Systems:  As  per HPI- otherwise negative.   Physical Examination: Vitals:   06/19/24 1327  BP: 104/68  Pulse: 62  Temp: 97.7 F (36.5 C)  SpO2: 98%   Vitals:   06/19/24 1327  Weight: 187 lb (84.8 kg)  Height: 6' 2 (1.88 m)   Body mass index is 24.01 kg/m. Ideal Body Weight: Weight in (lb) to have BMI = 25: 194.3  GEN: no acute distress. Normal weight, looks well  HEENT: Atraumatic, Normocephalic. Bilateral TM wnl, oropharynx normal.  PEERL,EOMI.   Ears and Nose: No external deformity. CV: RRR, No M/G/R. No JVD. No thrill. No extra heart sounds. PULM: CTA B, no wheezes, crackles, rhonchi. No retractions. No resp. distress. No accessory muscle use. ABD: S, NT, ND,. No rebound. No HSM. EXTR: No c/c/e PSYCH: Normally interactive. Conversant.    Assessment and Plan: Physical exam  Screening for diabetes mellitus - Plan: Comprehensive metabolic panel with GFR, Hemoglobin A1c  Screening for hyperlipidemia - Plan: Lipid panel  Screening for prostate cancer - Plan: PSA  Screening for deficiency anemia - Plan: CBC  Physical  exam- encouraged healthy diet and exercise routine Will plan further follow- up pending labs.  Colon cancer screening UTD Reminded due next year Recommend flu shot this fall He notes his father has a liver issue of some sort- offered a RUQ US . He declines for now- await labs    Signed Harlene Schroeder, MD  Addendum 8/26, received labs as below.  Message to patient  Results for orders placed or performed in visit on 06/19/24  CBC   Collection Time: 06/19/24  2:09 PM  Result Value Ref Range   WBC 5.2 4.0 - 10.5 K/uL   RBC 4.87 4.22 - 5.81 Mil/uL   Platelets 234.0 150.0 - 400.0 K/uL   Hemoglobin 15.6 13.0 - 17.0 g/dL   HCT 53.8 60.9 - 47.9 %   MCV 94.7 78.0 - 100.0 fl   MCHC 33.8 30.0 - 36.0 g/dL   RDW 87.4 88.4 - 84.4 %  Comprehensive metabolic panel with GFR   Collection Time: 06/19/24  2:09 PM  Result Value Ref Range   Sodium 141 135 - 145 mEq/L    Potassium 4.4 3.5 - 5.1 mEq/L   Chloride 102 96 - 112 mEq/L   CO2 30 19 - 32 mEq/L   Glucose, Bld 106 (H) 70 - 99 mg/dL   BUN 19 6 - 23 mg/dL   Creatinine, Ser 8.97 0.40 - 1.50 mg/dL   Total Bilirubin 0.8 0.2 - 1.2 mg/dL   Alkaline Phosphatase 56 39 - 117 U/L   AST 18 0 - 37 U/L   ALT 16 0 - 53 U/L   Total Protein 7.1 6.0 - 8.3 g/dL   Albumin 4.6 3.5 - 5.2 g/dL   GFR 12.42 >39.99 mL/min   Calcium 9.7 8.4 - 10.5 mg/dL  Hemoglobin J8r   Collection Time: 06/19/24  2:09 PM  Result Value Ref Range   Hgb A1c MFr Bld 5.6 4.6 - 6.5 %  Lipid panel   Collection Time: 06/19/24  2:09 PM  Result Value Ref Range   Cholesterol 167 0 - 200 mg/dL   Triglycerides 08.9 0.0 - 149.0 mg/dL   HDL 52.29 >60.99 mg/dL   VLDL 81.7 0.0 - 59.9 mg/dL   LDL Cholesterol 898 (H) 0 - 99 mg/dL   Total CHOL/HDL Ratio 4    NonHDL 119.58   PSA   Collection Time: 06/19/24  2:09 PM  Result Value Ref Range   PSA 0.82 0.10 - 4.00 ng/mL   Lab Results  Component Value Date   PSA 0.82 06/19/2024   PSA 0.84 06/16/2023   PSA 0.55 06/04/2022

## 2024-06-19 ENCOUNTER — Ambulatory Visit (INDEPENDENT_AMBULATORY_CARE_PROVIDER_SITE_OTHER): Admitting: Family Medicine

## 2024-06-19 VITALS — BP 104/68 | HR 62 | Temp 97.7°F | Ht 74.0 in | Wt 187.0 lb

## 2024-06-19 DIAGNOSIS — Z130 Encounter for screening for diseases of the blood and blood-forming organs and certain disorders involving the immune mechanism: Secondary | ICD-10-CM

## 2024-06-19 DIAGNOSIS — Z13220 Encounter for screening for lipoid disorders: Secondary | ICD-10-CM | POA: Diagnosis not present

## 2024-06-19 DIAGNOSIS — Z0000 Encounter for general adult medical examination without abnormal findings: Secondary | ICD-10-CM | POA: Diagnosis not present

## 2024-06-19 DIAGNOSIS — Z131 Encounter for screening for diabetes mellitus: Secondary | ICD-10-CM | POA: Diagnosis not present

## 2024-06-19 DIAGNOSIS — Z125 Encounter for screening for malignant neoplasm of prostate: Secondary | ICD-10-CM | POA: Diagnosis not present

## 2024-06-20 ENCOUNTER — Encounter: Payer: Self-pay | Admitting: Family Medicine

## 2024-06-20 LAB — CBC
HCT: 46.1 % (ref 39.0–52.0)
Hemoglobin: 15.6 g/dL (ref 13.0–17.0)
MCHC: 33.8 g/dL (ref 30.0–36.0)
MCV: 94.7 fl (ref 78.0–100.0)
Platelets: 234 K/uL (ref 150.0–400.0)
RBC: 4.87 Mil/uL (ref 4.22–5.81)
RDW: 12.5 % (ref 11.5–15.5)
WBC: 5.2 K/uL (ref 4.0–10.5)

## 2024-06-20 LAB — COMPREHENSIVE METABOLIC PANEL WITH GFR
ALT: 16 U/L (ref 0–53)
AST: 18 U/L (ref 0–37)
Albumin: 4.6 g/dL (ref 3.5–5.2)
Alkaline Phosphatase: 56 U/L (ref 39–117)
BUN: 19 mg/dL (ref 6–23)
CO2: 30 meq/L (ref 19–32)
Calcium: 9.7 mg/dL (ref 8.4–10.5)
Chloride: 102 meq/L (ref 96–112)
Creatinine, Ser: 1.02 mg/dL (ref 0.40–1.50)
GFR: 87.57 mL/min (ref >60.00)
Glucose, Bld: 106 mg/dL — ABNORMAL HIGH (ref 70–99)
Potassium: 4.4 meq/L (ref 3.5–5.1)
Sodium: 141 meq/L (ref 135–145)
Total Bilirubin: 0.8 mg/dL (ref 0.2–1.2)
Total Protein: 7.1 g/dL (ref 6.0–8.3)

## 2024-06-20 LAB — LIPID PANEL
Cholesterol: 167 mg/dL (ref 0–200)
HDL: 47.7 mg/dL (ref >39.00)
LDL Cholesterol: 101 mg/dL — ABNORMAL HIGH (ref 0–99)
NonHDL: 119.58
Total CHOL/HDL Ratio: 4
Triglycerides: 91 mg/dL (ref 0.0–149.0)
VLDL: 18.2 mg/dL (ref 0.0–40.0)

## 2024-06-20 LAB — HEMOGLOBIN A1C: Hgb A1c MFr Bld: 5.6 % (ref 4.6–6.5)

## 2024-06-20 LAB — PSA: PSA: 0.82 ng/mL (ref 0.10–4.00)

## 2024-07-07 ENCOUNTER — Encounter: Payer: Self-pay | Admitting: Family Medicine
